# Patient Record
Sex: Male | Born: 1950
Health system: Southern US, Community
[De-identification: ages and names within clinical notes are randomized; demographics above are authoritative.]

## PROBLEM LIST (undated history)

## (undated) DIAGNOSIS — K5730 Diverticulosis of large intestine without perforation or abscess without bleeding: Secondary | ICD-10-CM

## (undated) DIAGNOSIS — Z87442 Personal history of urinary calculi: Secondary | ICD-10-CM

## (undated) DIAGNOSIS — I10 Essential (primary) hypertension: Secondary | ICD-10-CM

## (undated) DIAGNOSIS — K635 Polyp of colon: Secondary | ICD-10-CM

## (undated) DIAGNOSIS — K219 Gastro-esophageal reflux disease without esophagitis: Secondary | ICD-10-CM

## (undated) DIAGNOSIS — G4733 Obstructive sleep apnea (adult) (pediatric): Secondary | ICD-10-CM

## (undated) DIAGNOSIS — F32A Depression, unspecified: Secondary | ICD-10-CM

## (undated) DIAGNOSIS — F419 Anxiety disorder, unspecified: Secondary | ICD-10-CM

## (undated) DIAGNOSIS — R011 Cardiac murmur, unspecified: Secondary | ICD-10-CM

## (undated) DIAGNOSIS — E669 Obesity, unspecified: Secondary | ICD-10-CM

## (undated) DIAGNOSIS — F411 Generalized anxiety disorder: Secondary | ICD-10-CM

## (undated) HISTORY — PX: COLONOSCOPY: SHX174

## (undated) HISTORY — DX: Anxiety disorder, unspecified: F41.9

## (undated) HISTORY — PX: FRACTURE SURGERY: SHX138

## (undated) HISTORY — DX: Polyp of colon: K63.5

## (undated) HISTORY — DX: Obstructive sleep apnea (adult) (pediatric): G47.33

## (undated) HISTORY — PX: EYE SURGERY: SHX253

## (undated) HISTORY — DX: Gastro-esophageal reflux disease without esophagitis: K21.9

## (undated) HISTORY — DX: Generalized anxiety disorder: F41.1

## (undated) HISTORY — DX: Diverticulosis of large intestine without perforation or abscess without bleeding: K57.30

## (undated) HISTORY — PX: WRIST SURGERY: SHX841

## (undated) HISTORY — DX: Obesity, unspecified: E66.9

---

## 2001-02-03 ENCOUNTER — Emergency Department (HOSPITAL_COMMUNITY): Admission: EM | Admit: 2001-02-03 | Discharge: 2001-02-03 | Payer: Self-pay | Admitting: *Deleted

## 2001-02-03 ENCOUNTER — Inpatient Hospital Stay (HOSPITAL_COMMUNITY): Admission: RE | Admit: 2001-02-03 | Discharge: 2001-02-05 | Payer: Self-pay | Admitting: Internal Medicine

## 2001-05-22 DIAGNOSIS — K635 Polyp of colon: Secondary | ICD-10-CM

## 2001-05-22 HISTORY — DX: Polyp of colon: K63.5

## 2003-09-02 ENCOUNTER — Ambulatory Visit (HOSPITAL_BASED_OUTPATIENT_CLINIC_OR_DEPARTMENT_OTHER): Admission: RE | Admit: 2003-09-02 | Discharge: 2003-09-02 | Payer: Self-pay | Admitting: Internal Medicine

## 2003-12-09 ENCOUNTER — Ambulatory Visit (HOSPITAL_BASED_OUTPATIENT_CLINIC_OR_DEPARTMENT_OTHER): Admission: RE | Admit: 2003-12-09 | Discharge: 2003-12-09 | Payer: Self-pay | Admitting: Pulmonary Disease

## 2004-07-25 ENCOUNTER — Ambulatory Visit: Payer: Self-pay | Admitting: Internal Medicine

## 2005-01-19 ENCOUNTER — Ambulatory Visit: Payer: Self-pay | Admitting: Internal Medicine

## 2006-05-22 DIAGNOSIS — K5730 Diverticulosis of large intestine without perforation or abscess without bleeding: Secondary | ICD-10-CM

## 2006-05-22 HISTORY — DX: Diverticulosis of large intestine without perforation or abscess without bleeding: K57.30

## 2007-03-18 ENCOUNTER — Ambulatory Visit: Payer: Self-pay | Admitting: Internal Medicine

## 2007-03-26 ENCOUNTER — Ambulatory Visit: Payer: Self-pay | Admitting: Internal Medicine

## 2007-03-26 ENCOUNTER — Encounter: Payer: Self-pay | Admitting: Internal Medicine

## 2007-04-24 ENCOUNTER — Encounter: Payer: Self-pay | Admitting: Internal Medicine

## 2007-08-10 ENCOUNTER — Telehealth: Payer: Self-pay | Admitting: Internal Medicine

## 2007-08-10 DIAGNOSIS — G4733 Obstructive sleep apnea (adult) (pediatric): Secondary | ICD-10-CM | POA: Insufficient documentation

## 2007-08-10 DIAGNOSIS — S335XXA Sprain of ligaments of lumbar spine, initial encounter: Secondary | ICD-10-CM

## 2010-10-07 NOTE — Procedures (Signed)
NAME:  Jorge Burton, SANROMAN              ACCOUNT NO.:  [id number]   MEDICAL RECORD NO.:  [id number]          PATIENT TYPE:  OUT   LOCATION:  SLEEP CENTER                 FACILITY:  [facility]   PHYSICIAN:  Marcelyn Bruins, M.D. [facility] DATE OF BIRTH:  05/15/1951   DATE OF ADMISSION:  12/09/2003  DATE OF DISCHARGE:  12/09/2003                              NOCTURNAL POLYSOMNOGRAM   REFERRING PHYSICIAN:  Marcelyn Bruins, M.D.   INDICATION FOR THE STUDY:  Hypersomnia with sleep apnea.  The patient is  here for a continuous positive airway pressure titration study for pressure  optimization.   SLEEP ARCHITECTURE:  Patient had a total sleep time of 386 minutes with no  slow wave sleep and decreased REM.  Sleep onset latency was normal and REM  latency was slightly prolonged.   IMPRESSION:  1. Good control of previously documented obstructive sleep apnea with     continuous positive airway pressure of 14 cm.  Patient used his full face     mask from home without difficulty.  2. No clinically significant cardiac arrhythmias.  3. Moderate numbers of leg jerks without significant sleep disruption.                                   ______________________________                                Marcelyn Bruins, M.D. [facility]     KC/MEDQ  D:  12/11/2003 16:27:43  T:  12/11/2003 19:36:51  Job:  [id number]

## 2012-03-29 ENCOUNTER — Encounter: Payer: Self-pay | Admitting: Internal Medicine

## 2012-09-30 ENCOUNTER — Encounter: Payer: Self-pay | Admitting: Internal Medicine

## 2013-09-26 ENCOUNTER — Encounter: Payer: Self-pay | Admitting: Internal Medicine

## 2013-10-01 ENCOUNTER — Encounter: Payer: Self-pay | Admitting: Pulmonary Disease

## 2013-10-01 ENCOUNTER — Telehealth: Payer: Self-pay | Admitting: Pulmonary Disease

## 2013-10-01 ENCOUNTER — Ambulatory Visit (INDEPENDENT_AMBULATORY_CARE_PROVIDER_SITE_OTHER): Payer: BC Managed Care – PPO | Admitting: Pulmonary Disease

## 2013-10-01 ENCOUNTER — Encounter (INDEPENDENT_AMBULATORY_CARE_PROVIDER_SITE_OTHER): Payer: Self-pay

## 2013-10-01 VITALS — BP 140/88 | HR 71 | Temp 98.1°F | Ht 67.0 in | Wt 212.6 lb

## 2013-10-01 DIAGNOSIS — G4733 Obstructive sleep apnea (adult) (pediatric): Secondary | ICD-10-CM

## 2013-10-01 NOTE — Assessment & Plan Note (Signed)
The patient has a history of obstructive sleep apnea, and has been on CPAP since 2005 been doing well. He continues to be compliant with the device, but his humidifier quit working, and is having issues with his aged machine. He will obviously need a new CPAP machine, as well as a new mask and supplies. I will set his new device on the automatic setting in light of his weight gain over the years, and he will let us know if he prefers a fixed pressure setting. I've also encouraged him to work aggressively on weight loss.

## 2013-10-01 NOTE — Telephone Encounter (Signed)
Jorge Burton with [facility] calling wanting to let Dr Gwenette Greet know that they have not been able to locate pt sleep study--have called [facility] and they do not have this study in file room. States also that they do not have this study in their files either.  Per Jorge Burton, a new sleep study will need to be ordered before moving forward.  Please advise Dr Gwenette Greet. Thanks.

## 2013-10-01 NOTE — Patient Instructions (Signed)
Will get you a new cpap machine, and set on auto to self regulate.  Please call if you would rather go back to a fixed pressure. Work on weight loss followup with me again in one year if doing well.

## 2013-10-01 NOTE — Progress Notes (Signed)
Subjective:    Patient ID: Jorge Burton, male    DOB: 1950-09-28, 63 y.o.   MRN: [id number]  HPI The patient is a 63 year old male who I've been asked to see for management of obstructive sleep apnea. He was diagnosed in 2005, and has been on CPAP at 14 cm since that time. He has done very well with the device over the years, but currently is having issues with his machine and her humidifier. He uses nasal pillows, and is not having issues with oral venting. He has done very well with the device over the years, and continues to feel that he sleeps well. He is rested in the mornings upon arising, and denies any alertness issues during the day. His Epworth score today is 9. He tells me that he has gained about 20 pounds from his initial sleep study, and has not had any changes to his device. He is also in need of a new mask and headgear.   Sleep Questionnaire What time do you typically go to bed?( Between what hours) 11pm 11pm at 0900 on 10/01/13 by Lilli Few, CMA How long does it take you to fall asleep? 15 minutes 15 minutes at 0900 on 10/01/13 by Lilli Few, CMA How many times during the night do you wake up? 2 2 at 0900 on 10/01/13 by Lilli Few, [place] What time do you get out of bed to start your day? 0600 0600 at 0900 on 10/01/13 by Lilli Few, CMA Do you drive or operate heavy machinery in your occupation? No No at 0900 on 10/01/13 by Lilli Few, CMA How much has your weight changed (up or down) over the past two years? (In pounds) 20 lb (9.072 kg) 20 lb (9.072 kg) at 0900 on 10/01/13 by Lilli Few, CMA Have you ever had a sleep study before? Yes Yes at 0900 on 10/01/13 by Lilli Few, CMA If yes, location of study? [facility] at 0900 on 10/01/13 by Lilli Few, CMA If yes, date of study? 2005 2005 at 0900 on 10/01/13 by Lilli Few, CMA Do you currently use CPAP? Yes Yes at 0900  on 10/01/13 by Lilli Few, CMA If so, what pressure? unknown unknown at 0900 on 10/01/13 by Lilli Few, CMA Do you wear oxygen at any time? No No at 0900 on 10/01/13 by Lilli Few, CMA   Review of Systems  Constitutional: Negative for fever and unexpected weight change.  HENT: Positive for dental problem and trouble swallowing. Negative for congestion, ear pain, nosebleeds, postnasal drip, rhinorrhea, sinus pressure, sneezing and sore throat.   Eyes: Negative for redness and itching.  Respiratory: Negative for cough, chest tightness, shortness of breath and wheezing.   Cardiovascular: Negative for palpitations and leg swelling.  Gastrointestinal: Negative for nausea and vomiting.  Genitourinary: Negative for dysuria.  Musculoskeletal: Negative for joint swelling.  Skin: Negative for rash.  Neurological: Negative for headaches.  Hematological: Does not bruise/bleed easily.  Psychiatric/Behavioral: Negative for dysphoric mood. The patient is nervous/anxious.        Objective:   Physical Exam Constitutional:  Overweight male, no acute distress  HENT:  Nares patent without discharge, mildly narrowed bilat.  Oropharynx without exudate, palate and uvula are elongated.  Eyes:  Perrla, eomi, no scleral icterus  Neck:  No JVD, no TMG  Cardiovascular:  Normal rate, regular rhythm, no rubs or gallops.  No murmurs  Intact distal pulses  Pulmonary :  Normal breath sounds, no stridor or respiratory distress   No rales, rhonchi, or wheezing  Abdominal:  Soft, nondistended, bowel sounds present.  No tenderness noted.   Musculoskeletal:  No lower extremity edema noted.  Lymph Nodes:  No cervical lymphadenopathy noted  Skin:  No cyanosis noted  Neurologic:  Alert, appropriate, moves all 4 extremities without obvious deficit.         Assessment & Plan:

## 2013-10-02 NOTE — Telephone Encounter (Signed)
Who has been doing his supplies and mask changes over the years.  If it is [facility], he does NOT need another sleep study.  If it is someone else, he needs to see whoever that is about a new machine.

## 2013-10-03 NOTE — Telephone Encounter (Signed)
lmtcb x1 for [employer]

## 2013-10-07 ENCOUNTER — Encounter: Payer: Self-pay | Admitting: Internal Medicine

## 2013-10-07 ENCOUNTER — Encounter: Payer: Self-pay | Admitting: Pulmonary Disease

## 2013-10-07 NOTE — Telephone Encounter (Signed)
mctcb for [email or web address]  [name]

## 2013-10-16 ENCOUNTER — Emergency Department: Payer: Self-pay | Admitting: Emergency Medicine

## 2013-11-25 ENCOUNTER — Ambulatory Visit (INDEPENDENT_AMBULATORY_CARE_PROVIDER_SITE_OTHER): Payer: BC Managed Care – PPO | Admitting: Internal Medicine

## 2013-11-25 ENCOUNTER — Encounter: Payer: Self-pay | Admitting: Internal Medicine

## 2013-11-25 VITALS — BP 120/80 | HR 72 | Ht 67.0 in | Wt 208.0 lb

## 2013-11-25 DIAGNOSIS — R1314 Dysphagia, pharyngoesophageal phase: Secondary | ICD-10-CM

## 2013-11-25 DIAGNOSIS — Z800 Family history of malignant neoplasm of digestive organs: Secondary | ICD-10-CM

## 2013-11-25 DIAGNOSIS — Z86010 Personal history of colonic polyps: Secondary | ICD-10-CM

## 2013-11-25 MED ORDER — MOVIPREP 100 G PO SOLR: ORAL | Status: DC

## 2013-11-25 NOTE — Patient Instructions (Signed)
You have been scheduled for an endoscopy and colonoscopy. Please follow the written instructions given to you at your visit today. Please pick up your prep at the pharmacy within the next 1-3 days. If you use inhalers (even only as needed), please bring them with you on the day of your procedure. Your physician has requested that you go to www.startemmi.com and enter the access code given to you at your visit today. This web site gives a general overview about your procedure. However, you should still follow specific instructions given to you by our office regarding your preparation for the procedure.  I appreciate the opportunity to care for you.

## 2013-11-26 ENCOUNTER — Encounter: Payer: Self-pay | Admitting: Internal Medicine

## 2013-11-26 NOTE — Progress Notes (Signed)
Subjective:    Patient ID: Jorge Burton, male    DOB: [birth year], 63 y.o.   MRN: [id number]  HPI The patient is here because of intermittent solid dysphagia. He is also due for a routine repeat colonoscopy  - hx polyps and FHx rectal cancer in elderly father.  He has had years of the intermittent soild dysphagia with food impactions and regurgitation. No weight loss or bleeding.   GI ROS o/w negative.  Allergies  Allergen Reactions  . Penicillins    Outpatient Prescriptions Prior to Visit  Medication Sig Dispense Refill  . aspirin 81 MG tablet Take 81 mg by mouth daily.      Marland Kitchen escitalopram (LEXAPRO) 10 MG tablet Take 1 tablet by mouth daily.       No facility-administered medications prior to visit.   Past Medical History  Diagnosis Date  . Generalized anxiety disorder   . OSA (obstructive sleep apnea)     CPAP  . Obesity   . Diverticulosis of colon (without mention of hemorrhage) 2008  . Colon polyps 2003    Adenomatous polyp  . Anxiety    Past Surgical History  Procedure Laterality Date  . Wrist surgery    . Colonoscopy      mulitple    History   Social History  . Marital Status: Married    Spouse Name: N/A    Number of Children: 2  . Years of Education: N/A   Occupational History  . teacher     [employer]  . professor -retired [employer]   Social History Main Topics  . Smoking status: Never Smoker   . Smokeless tobacco: Never Used  . Alcohol Use: No  . Drug Use: No  .      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 Colon cancer Father 41  . Heart attack Father   .  Heart failure Mother     Review of Systems + allergies, anxiety and reduced vision All other ROS negative    Objective:   Physical Exam General:  Well-developed, well-nourished and in no acute distress Eyes:  anicteric. ENT:   Mouth and posterior pharynx free of lesions.  Neck:   supple w/o thyromegaly or mass.  Lungs: Clear to auscultation bilaterally. Heart:  S1S2, no rubs, murmurs, gallops. Abdomen:  soft, non-tender, no hepatosplenomegaly, hernia, or mass and BS+.  Rectal: deferred Lymph:  no cervical or supraclavicular adenopathy. Extremities:   no edema Neuro:  A&O x 3.  Psych:  appropriate mood and  Affect.   Data Reviewed: PCP note Prior colonoscopy CMET CBC ok 11/19/2013     Assessment & Plan:  Dysphagia, pharyngoesophageal phase -    EGD, dilate esophagus. The risks and benefits as well as alternatives of endoscopic procedure(s) have been discussed and reviewed. All questions answered. The patient agrees to proceed.   Personal history of colonic polyps -   Family history of rectal cancer father >70 -    Surveillance colonoscopy The risks and benefits as well as alternatives of endoscopic procedure(s) have been discussed and reviewed. All questions answered. The patient  agrees to proceed.   I appreciate the opportunity to care for this patient. JS:EGBT, Gwyndolyn Saxon, MD

## 2013-12-11 ENCOUNTER — Encounter: Payer: Self-pay | Admitting: Internal Medicine

## 2013-12-11 ENCOUNTER — Ambulatory Visit (AMBULATORY_SURGERY_CENTER): Payer: BC Managed Care – PPO | Admitting: Internal Medicine

## 2013-12-11 VITALS — BP 126/73 | HR 65 | Temp 96.9°F | Resp 14 | Ht 67.0 in | Wt 208.0 lb

## 2013-12-11 DIAGNOSIS — D126 Benign neoplasm of colon, unspecified: Secondary | ICD-10-CM

## 2013-12-11 DIAGNOSIS — K222 Esophageal obstruction: Secondary | ICD-10-CM

## 2013-12-11 DIAGNOSIS — Z860100 Personal history of colon polyps, unspecified: Secondary | ICD-10-CM | POA: Insufficient documentation

## 2013-12-11 DIAGNOSIS — K219 Gastro-esophageal reflux disease without esophagitis: Secondary | ICD-10-CM

## 2013-12-11 DIAGNOSIS — R1314 Dysphagia, pharyngoesophageal phase: Secondary | ICD-10-CM

## 2013-12-11 DIAGNOSIS — N402 Nodular prostate without lower urinary tract symptoms: Secondary | ICD-10-CM | POA: Insufficient documentation

## 2013-12-11 MED ORDER — PANTOPRAZOLE SODIUM 40 MG PO TBEC: 40.0000 mg | DELAYED_RELEASE_TABLET | Freq: Every day | ORAL | Status: DC

## 2013-12-11 MED ORDER — SODIUM CHLORIDE 0.9 % IV SOLN: 500.0000 mL | INTRAVENOUS | Status: DC

## 2013-12-11 NOTE — Patient Instructions (Addendum)
There was a stricture at junction of esophagus and stomach - I dilated it to 15 mm. Otherwise ok.  You need to start pantoprazole for this - to prevent recurrent stricture formation. Pick up at pharmacy - prescription sent.  I found and removed 6 polyps - they all look benign.  I will let you know pathology results and when to have another routine colonoscopy by mail.   There is a nodule on your prostate - you need to have this evaluated by a urologist unless that has already been done.  I appreciate the opportunity to care for you. Gatha Mayer, MD, [facility]   Discharge instructions given with verbal understanding. Handouts on a dilatation diet,polyps and diverticulosis. Resume previous medications. YOU HAD AN ENDOSCOPIC PROCEDURE TODAY AT [facility]: Refer to the procedure report that was given to you for any specific questions about what was found during the examination.  If the procedure report does not answer your questions, please call your gastroenterologist to clarify.  If you requested that your care partner not be given the details of your procedure findings, then the procedure report has been included in a sealed envelope for you to review at your convenience later.  YOU SHOULD EXPECT: Some feelings of bloating in the abdomen. Passage of more gas than usual.  Walking can help get rid of the air that was put into your GI tract during the procedure and reduce the bloating. If you had a lower endoscopy (such as a colonoscopy or flexible sigmoidoscopy) you may notice spotting of blood in your stool or on the toilet paper. If you underwent a bowel prep for your procedure, then you may not have a normal bowel movement for a few days.  DIET: Your first meal following the procedure should be a light meal and then it is ok to progress to your normal diet.  A half-sandwich or bowl of soup is an example of a good first meal.  Heavy or fried foods are harder to digest and may  make you feel nauseous or bloated.  Likewise meals heavy in dairy and vegetables can cause extra gas to form and this can also increase the bloating.  Drink plenty of fluids but you should avoid alcoholic beverages for 24 hours.  ACTIVITY: Your care partner should take you home directly after the procedure.  You should plan to take it easy, moving slowly for the rest of the day.  You can resume normal activity the day after the procedure however you should NOT DRIVE or use heavy machinery for 24 hours (because of the sedation medicines used during the test).    SYMPTOMS TO REPORT IMMEDIATELY: A gastroenterologist can be reached at any hour.  During normal business hours, 8:30 AM to 5:00 PM Monday through Friday, call [phone].  After hours and on weekends, please call the GI answering service at [phone] who will take a message and have the physician on call contact you.   Following lower endoscopy (colonoscopy or flexible sigmoidoscopy):  Excessive amounts of blood in the stool  Significant tenderness or worsening of abdominal pains  Swelling of the abdomen that is new, acute  Fever of 100F or higher  Following upper endoscopy (EGD)  Vomiting of blood or coffee ground material  New chest pain or pain under the shoulder blades  Painful or persistently difficult swallowing  New shortness of breath  Fever of 100F or higher  Black, tarry-looking stools  FOLLOW UP: If any  biopsies were taken you will be contacted by phone or by letter within the next 1-3 weeks.  Call your gastroenterologist if you have not heard about the biopsies in 3 weeks.  Our staff will call the home number listed on your records the next business day following your procedure to check on you and address any questions or concerns that you may have at that time regarding the information given to you following your procedure. This is a courtesy call and so if there is no answer at the home number and we have not  heard from you through the emergency physician on call, we will assume that you have returned to your regular daily activities without incident.  SIGNATURES/CONFIDENTIALITY: You and/or your care partner have signed paperwork which will be entered into your electronic medical record.  These signatures attest to the fact that that the information above on your After Visit Summary has been reviewed and is understood.  Full responsibility of the confidentiality of this discharge information lies with you and/or your care-partner.

## 2013-12-11 NOTE — Progress Notes (Signed)
A/ox3 pleased with MAC, report to Celia RN 

## 2013-12-11 NOTE — Op Note (Signed)
[place]  [address] [place], [address]   COLONOSCOPY PROCEDURE REPORT  PATIENT: Jorge Burton, Jorge Burton  MR#: [id number] BIRTHDATE: 07/25/1950 , 10  yrs. old GENDER: Male ENDOSCOPIST: Gatha Mayer, MD, [facility] REFERRED BY:W.  Lutricia Feil, M.D. PROCEDURE DATE:  12/11/2013 PROCEDURE:   Colonoscopy with snare polypectomy First Screening Colonoscopy - Avg.  risk and is 50 yrs.  old or older - No.  Prior Negative Screening - Now for repeat screening. N/A  History of Adenoma - Now for follow-up colonoscopy & has been > or = to 3 yrs.  Yes hx of adenoma.  Has been 3 or more years since last colonoscopy.  Polyps Removed Today? Yes. ASA CLASS:   Class II INDICATIONS:Patient's personal history of adenomatous colon polyps.  MEDICATIONS: Propofol (Diprivan) 160 mg IV, There was residual sedation effect present from prior procedure, MAC sedation, administered by CRNA, and These medications were titrated to patient response per physician's verbal order  DESCRIPTION OF PROCEDURE:   After the risks benefits and alternatives of the procedure were thoroughly explained, informed consent was obtained.  A digital rectal exam revealed a nodule prostate.   The LB [id number]  endoscope was introduced through the anus and advanced to the cecum, which was identified by both the appendix and ileocecal valve. No adverse events experienced.   The quality of the prep was excellent, using MoviPrep  The instrument was then slowly withdrawn as the colon was fully examined.      COLON FINDINGS: Six sessile polyps measuring 2-5 mm in size were found in the transverse colon, sigmoid colon, and rectum.  A polypectomy was performed with cold forceps (2) and with a cold snare (4).  The resection was complete and the polyp tissue was completely retrieved.   Moderate diverticulosis was noted in the sigmoid colon.   The colon mucosa was otherwise normal.   Small prostate nodule - just to  left of median fissure and distal half of lobe.  Retroflexed views revealed no abnormalities. The time to cecum=minutes 0 seconds.  Withdrawal time=2 minutes 46 seconds. The scope was withdrawn and the procedure completed. COMPLICATIONS: There were no complications.  ENDOSCOPIC IMPRESSION: 1.   Six sessile polyps measuring 2-5 mm in size were found in the transverse colon, sigmoid colon, and rectum; polypectomy was performed with cold forceps and with a cold snare 2.   Moderate diverticulosis was noted in the sigmoid colon 3.   The colon mucosa was otherwise normal 4.   Small prostate nodule - just to left of median fissure and distal half of lobe  RECOMMENDATIONS: 1.  Timing of repeat colonoscopy will be determined by pathology findings. 2.   Needs to see urologist unless already has - patient to let Dr. Brigitte Pulse know and I will cc Dr.  Brigitte Pulse also   eSigned:  Gatha Mayer, MD, [facility] 12/11/2013 10:23 AM   cc: Janalyn Rouse, MD and The Patient   PATIENT NAME:  [name], [name] MR#: [id number]

## 2013-12-11 NOTE — Op Note (Addendum)
[place]  [address] [place], [address]   ENDOSCOPY PROCEDURE REPORT  PATIENT: Jorge Burton, Jorge Burton  MR#: [id number] BIRTHDATE: Jul 23, 1950 , 49  yrs. old GENDER: Male ENDOSCOPIST: Gatha Mayer, MD, [facility] REFERRED BY:  Janalyn Rouse PROCEDURE DATE:  12/11/2013 PROCEDURE:  EGD, diagnostic  + balloon dilation < 30 mm ASA CLASS:     Class II INDICATIONS:  Dysphagia. MEDICATIONS: Propofol (Diprivan) 240 mg IV, MAC sedation, administered by CRNA, and These medications were titrated to patient response per physician's verbal order TOPICAL ANESTHETIC: none  DESCRIPTION OF PROCEDURE: After the risks benefits and alternatives of the procedure were thoroughly explained, informed consent was obtained.  The LB [id number] endoscope was introduced through the mouth and advanced to the second portion of the duodenum. Without limitations.  The instrument was slowly withdrawn as the mucosa was fully examined.    ESOPHAGUS: A stricture was found at the gastroesophageal junction. The stenosis was non traversable with the endoscope until it was dilated to 15 mm with a balloon - appropriate heme seen..  The remainder of the upper endoscopy exam was otherwise normal. Retroflexed views revealed as above.     The scope was then withdrawn from the patient and the procedure completed.  COMPLICATIONS: There were no complications. ENDOSCOPIC IMPRESSION: 1.   Stricture was found at the gastroesophageal junction - dilated to 15 mm 2.   The remainder of the upper endoscopy exam was otherwise normal  RECOMMENDATIONS: 1.  Clear liquids until , then soft foods rest iof day.  Resume prior diet tomorrow. 2.  PPI qam - pantoprazole 40 mg daily before breakfast 3.   repeat dilation if not swallowing without problems after 4 weeks 4.   Colonoscopy next   eSigned:  Gatha Mayer, MD, [facility] 12/11/2013 10:16 AM Revised: 12/11/2013 10:16 AM  CC:W.  Lutricia Feil, MD and The  Patient

## 2013-12-11 NOTE — Progress Notes (Signed)
Called to room to assist during endoscopic procedure.  Patient ID and intended procedure confirmed with present staff. Received instructions for my participation in the procedure from the performing physician.  

## 2013-12-12 ENCOUNTER — Telehealth: Payer: Self-pay | Admitting: *Deleted

## 2013-12-12 NOTE — Telephone Encounter (Signed)
  Follow up Call-  Call back number 12/11/2013  Post procedure Call Back phone  # [phone]  Permission to leave phone message Yes     Patient questions:  Do you have a fever, pain , or abdominal swelling? No. Pain Score  0 *  Have you tolerated food without any problems? Yes.    Have you been able to return to your normal activities? Yes.    Do you have any questions about your discharge instructions: Diet   No. Medications  No. Follow up visit  No.  Do you have questions or concerns about your Care? No.  Actions: * If pain score is 4 or above: No action needed, pain <4.

## 2013-12-18 ENCOUNTER — Encounter: Payer: Self-pay | Admitting: Internal Medicine

## 2013-12-18 NOTE — Progress Notes (Signed)
Quick Note:  2 adenomas max 6 mm Others hyperplastic Repeat colon 2020 ______

## 2014-03-13 ENCOUNTER — Telehealth: Payer: Self-pay

## 2014-03-13 NOTE — Telephone Encounter (Signed)
DId prior authorization for pantoprazole 40mg , dx: GERD K21.9, Stricture & Stenosis of esophagus K22.2.   Approved thru 03/13/2015.  Spoke with Neoma Laming at [employer] # [phone].  Notified [facility] in [place] of approval # [id number].

## 2014-07-09 DIAGNOSIS — H43813 Vitreous degeneration, bilateral: Secondary | ICD-10-CM | POA: Diagnosis not present

## 2014-07-09 DIAGNOSIS — Z961 Presence of intraocular lens: Secondary | ICD-10-CM | POA: Diagnosis not present

## 2014-07-09 DIAGNOSIS — H2512 Age-related nuclear cataract, left eye: Secondary | ICD-10-CM | POA: Diagnosis not present

## 2014-07-09 DIAGNOSIS — H26491 Other secondary cataract, right eye: Secondary | ICD-10-CM | POA: Diagnosis not present

## 2014-08-24 DIAGNOSIS — N402 Nodular prostate without lower urinary tract symptoms: Secondary | ICD-10-CM | POA: Diagnosis not present

## 2014-08-28 DIAGNOSIS — N402 Nodular prostate without lower urinary tract symptoms: Secondary | ICD-10-CM | POA: Diagnosis not present

## 2014-11-18 ENCOUNTER — Ambulatory Visit: Payer: BC Managed Care – PPO | Admitting: Pulmonary Disease

## 2014-12-03 DIAGNOSIS — Z125 Encounter for screening for malignant neoplasm of prostate: Secondary | ICD-10-CM | POA: Diagnosis not present

## 2014-12-03 DIAGNOSIS — M859 Disorder of bone density and structure, unspecified: Secondary | ICD-10-CM | POA: Diagnosis not present

## 2014-12-03 DIAGNOSIS — Z79899 Other long term (current) drug therapy: Secondary | ICD-10-CM | POA: Diagnosis not present

## 2014-12-03 DIAGNOSIS — M8580 Other specified disorders of bone density and structure, unspecified site: Secondary | ICD-10-CM | POA: Diagnosis not present

## 2014-12-03 DIAGNOSIS — E669 Obesity, unspecified: Secondary | ICD-10-CM | POA: Diagnosis not present

## 2014-12-09 DIAGNOSIS — G4733 Obstructive sleep apnea (adult) (pediatric): Secondary | ICD-10-CM | POA: Diagnosis not present

## 2014-12-09 DIAGNOSIS — Z86010 Personal history of colonic polyps: Secondary | ICD-10-CM | POA: Diagnosis not present

## 2014-12-09 DIAGNOSIS — N402 Nodular prostate without lower urinary tract symptoms: Secondary | ICD-10-CM | POA: Diagnosis not present

## 2014-12-09 DIAGNOSIS — H6123 Impacted cerumen, bilateral: Secondary | ICD-10-CM | POA: Diagnosis not present

## 2014-12-09 DIAGNOSIS — I10 Essential (primary) hypertension: Secondary | ICD-10-CM | POA: Diagnosis not present

## 2014-12-09 DIAGNOSIS — F411 Generalized anxiety disorder: Secondary | ICD-10-CM | POA: Diagnosis not present

## 2014-12-09 DIAGNOSIS — Z23 Encounter for immunization: Secondary | ICD-10-CM | POA: Diagnosis not present

## 2014-12-09 DIAGNOSIS — R1310 Dysphagia, unspecified: Secondary | ICD-10-CM | POA: Diagnosis not present

## 2014-12-09 DIAGNOSIS — M8580 Other specified disorders of bone density and structure, unspecified site: Secondary | ICD-10-CM | POA: Diagnosis not present

## 2014-12-09 DIAGNOSIS — Z6833 Body mass index (BMI) 33.0-33.9, adult: Secondary | ICD-10-CM | POA: Diagnosis not present

## 2014-12-09 DIAGNOSIS — Z0000 Encounter for general adult medical examination without abnormal findings: Secondary | ICD-10-CM | POA: Diagnosis not present

## 2014-12-10 DIAGNOSIS — Z1212 Encounter for screening for malignant neoplasm of rectum: Secondary | ICD-10-CM | POA: Diagnosis not present

## 2015-01-08 DIAGNOSIS — Z6833 Body mass index (BMI) 33.0-33.9, adult: Secondary | ICD-10-CM | POA: Diagnosis not present

## 2015-01-08 DIAGNOSIS — Z23 Encounter for immunization: Secondary | ICD-10-CM | POA: Diagnosis not present

## 2015-01-08 DIAGNOSIS — E669 Obesity, unspecified: Secondary | ICD-10-CM | POA: Diagnosis not present

## 2015-01-08 DIAGNOSIS — I10 Essential (primary) hypertension: Secondary | ICD-10-CM | POA: Diagnosis not present

## 2015-01-29 DIAGNOSIS — S82842A Displaced bimalleolar fracture of left lower leg, initial encounter for closed fracture: Secondary | ICD-10-CM | POA: Diagnosis not present

## 2015-01-29 DIAGNOSIS — I10 Essential (primary) hypertension: Secondary | ICD-10-CM | POA: Diagnosis not present

## 2015-01-29 DIAGNOSIS — R279 Unspecified lack of coordination: Secondary | ICD-10-CM | POA: Diagnosis not present

## 2015-01-29 DIAGNOSIS — Z7401 Bed confinement status: Secondary | ICD-10-CM | POA: Diagnosis not present

## 2015-01-29 DIAGNOSIS — S8262XA Displaced fracture of lateral malleolus of left fibula, initial encounter for closed fracture: Secondary | ICD-10-CM | POA: Diagnosis not present

## 2015-01-29 DIAGNOSIS — M25572 Pain in left ankle and joints of left foot: Secondary | ICD-10-CM | POA: Diagnosis not present

## 2015-12-13 DIAGNOSIS — Z125 Encounter for screening for malignant neoplasm of prostate: Secondary | ICD-10-CM | POA: Diagnosis not present

## 2015-12-13 DIAGNOSIS — I10 Essential (primary) hypertension: Secondary | ICD-10-CM | POA: Diagnosis not present

## 2015-12-13 DIAGNOSIS — M859 Disorder of bone density and structure, unspecified: Secondary | ICD-10-CM | POA: Diagnosis not present

## 2015-12-20 DIAGNOSIS — S82892S Other fracture of left lower leg, sequela: Secondary | ICD-10-CM | POA: Diagnosis not present

## 2015-12-20 DIAGNOSIS — R1310 Dysphagia, unspecified: Secondary | ICD-10-CM | POA: Diagnosis not present

## 2015-12-20 DIAGNOSIS — M8580 Other specified disorders of bone density and structure, unspecified site: Secondary | ICD-10-CM | POA: Diagnosis not present

## 2015-12-20 DIAGNOSIS — Z1389 Encounter for screening for other disorder: Secondary | ICD-10-CM | POA: Diagnosis not present

## 2015-12-20 DIAGNOSIS — E669 Obesity, unspecified: Secondary | ICD-10-CM | POA: Diagnosis not present

## 2015-12-20 DIAGNOSIS — R6882 Decreased libido: Secondary | ICD-10-CM | POA: Diagnosis not present

## 2015-12-20 DIAGNOSIS — I10 Essential (primary) hypertension: Secondary | ICD-10-CM | POA: Diagnosis not present

## 2015-12-20 DIAGNOSIS — G4733 Obstructive sleep apnea (adult) (pediatric): Secondary | ICD-10-CM | POA: Diagnosis not present

## 2015-12-20 DIAGNOSIS — Z0000 Encounter for general adult medical examination without abnormal findings: Secondary | ICD-10-CM | POA: Diagnosis not present

## 2015-12-20 DIAGNOSIS — Z86010 Personal history of colonic polyps: Secondary | ICD-10-CM | POA: Diagnosis not present

## 2015-12-20 DIAGNOSIS — N402 Nodular prostate without lower urinary tract symptoms: Secondary | ICD-10-CM | POA: Diagnosis not present

## 2015-12-20 DIAGNOSIS — M859 Disorder of bone density and structure, unspecified: Secondary | ICD-10-CM | POA: Diagnosis not present

## 2015-12-20 DIAGNOSIS — Z23 Encounter for immunization: Secondary | ICD-10-CM | POA: Diagnosis not present

## 2015-12-23 DIAGNOSIS — Z1212 Encounter for screening for malignant neoplasm of rectum: Secondary | ICD-10-CM | POA: Diagnosis not present

## 2016-02-15 ENCOUNTER — Institutional Professional Consult (permissible substitution): Payer: BC Managed Care – PPO | Admitting: Internal Medicine

## 2018-07-29 ENCOUNTER — Encounter (HOSPITAL_COMMUNITY)
Admission: RE | Disposition: A | Discharge status: Home / Self Care | Payer: Self-pay | Source: Home / Self Care | Attending: Ophthalmology

## 2018-07-29 ENCOUNTER — Inpatient Hospital Stay (HOSPITAL_COMMUNITY): Payer: Medicare Other | Admitting: Anesthesiology

## 2018-07-29 ENCOUNTER — Ambulatory Visit (HOSPITAL_COMMUNITY)
Admission: RE | Admit: 2018-07-29 | Discharge: 2018-07-29 | Disposition: A | Discharge status: Home / Self Care | Payer: Medicare Other | Attending: Ophthalmology | Admitting: Ophthalmology

## 2018-07-29 ENCOUNTER — Encounter (HOSPITAL_COMMUNITY): Payer: Self-pay | Admitting: Anesthesiology

## 2018-07-29 DIAGNOSIS — E669 Obesity, unspecified: Secondary | ICD-10-CM | POA: Insufficient documentation

## 2018-07-29 DIAGNOSIS — Z6835 Body mass index (BMI) 35.0-35.9, adult: Secondary | ICD-10-CM | POA: Diagnosis not present

## 2018-07-29 DIAGNOSIS — G4730 Sleep apnea, unspecified: Secondary | ICD-10-CM | POA: Insufficient documentation

## 2018-07-29 DIAGNOSIS — H30891 Other chorioretinal inflammations, right eye: Secondary | ICD-10-CM | POA: Insufficient documentation

## 2018-07-29 HISTORY — PX: EYE EXAMINATION UNDER ANESTHESIA: SHX1560

## 2018-07-29 LAB — CBC WITH DIFFERENTIAL/PLATELET
Abs Immature Granulocytes: 0.04 10*3/uL (ref 0.00–0.07)
BASOS ABS: 0.1 10*3/uL (ref 0.0–0.1)
Basophils Relative: 1 %
Eosinophils Absolute: 0.3 10*3/uL (ref 0.0–0.5)
Eosinophils Relative: 4 %
HCT: 42.1 % (ref 39.0–52.0)
Hemoglobin: 13.7 g/dL (ref 13.0–17.0)
Immature Granulocytes: 0 %
Lymphocytes Relative: 25 %
Lymphs Abs: 2.5 10*3/uL (ref 0.7–4.0)
MCH: 27.1 pg (ref 26.0–34.0)
MCHC: 32.5 g/dL (ref 30.0–36.0)
MCV: 83.4 fL (ref 80.0–100.0)
Monocytes Absolute: 0.7 10*3/uL (ref 0.1–1.0)
Monocytes Relative: 8 %
Neutro Abs: 6.1 10*3/uL (ref 1.7–7.7)
Neutrophils Relative %: 62 %
Platelets: 279 10*3/uL (ref 150–400)
RBC: 5.05 MIL/uL (ref 4.22–5.81)
RDW: 13.4 % (ref 11.5–15.5)
WBC: 9.7 10*3/uL (ref 4.0–10.5)
nRBC: 0 % (ref 0.0–0.2)

## 2018-07-29 LAB — HEPATIC FUNCTION PANEL
ALT: 19 U/L (ref 0–44)
AST: 20 U/L (ref 15–41)
Albumin: 3.8 g/dL (ref 3.5–5.0)
Alkaline Phosphatase: 45 U/L (ref 38–126)
Bilirubin, Direct: 0.1 mg/dL (ref 0.0–0.2)
Indirect Bilirubin: 0.5 mg/dL (ref 0.3–0.9)
Total Bilirubin: 0.6 mg/dL (ref 0.3–1.2)
Total Protein: 6.9 g/dL (ref 6.5–8.1)

## 2018-07-29 LAB — SEDIMENTATION RATE: Sed Rate: 14 mm/hr (ref 0–16)

## 2018-07-29 LAB — RAPID HIV SCREEN (HIV 1/2 AB+AG)
HIV 1/2 Antibodies: NONREACTIVE
HIV-1 P24 Antigen - HIV24: NONREACTIVE

## 2018-07-29 SURGERY — EXAM UNDER ANESTHESIA, EYE
Anesthesia: Monitor Anesthesia Care | Laterality: Right

## 2018-07-29 MED ORDER — TROPICAMIDE 1 % OP SOLN
2.0000 [drp] | Freq: Once | OPHTHALMIC | Status: DC
  Filled 2018-07-29: qty 15

## 2018-07-29 MED ORDER — DEXTROSE 5 % IV SOLN
600.0000 mg | Freq: Once | INTRAVENOUS | Status: DC
  Filled 2018-07-29: qty 12

## 2018-07-29 MED ORDER — POVIDONE-IODINE 5 % OP SOLN
OPHTHALMIC | Status: DC | PRN
  Administered 2018-07-29: 1 via OPHTHALMIC

## 2018-07-29 MED ORDER — DEXTROSE 5 % IV SOLN
660.0000 mg | Freq: Once | INTRAVENOUS | Status: AC
  Administered 2018-07-29: 660 mg via INTRAVENOUS
  Filled 2018-07-29: qty 13.2

## 2018-07-29 MED ORDER — LACTATED RINGERS IV SOLN
INTRAVENOUS | Status: DC | PRN
  Administered 2018-07-29: 22:00:00 via INTRAVENOUS

## 2018-07-29 MED ORDER — LIDOCAINE HCL (PF) 1 % IJ SOLN
INTRAMUSCULAR | Status: AC
  Filled 2018-07-29: qty 30

## 2018-07-29 MED ORDER — NEOMYCIN-POLYMYXIN-DEXAMETH 3.5-10000-0.1 OP OINT
TOPICAL_OINTMENT | OPHTHALMIC | Status: AC
  Filled 2018-07-29: qty 3.5

## 2018-07-29 MED ORDER — TETRACAINE HCL 0.5 % OP SOLN
OPHTHALMIC | Status: AC
  Filled 2018-07-29: qty 4

## 2018-07-29 MED ORDER — PHENYLEPHRINE HCL 2.5 % OP SOLN
OPHTHALMIC | Status: AC
  Filled 2018-07-29: qty 2

## 2018-07-29 MED ORDER — FOSCARNET INTRACAMERAL INJECTION 1.2 MG/0.05 ML KALEIDOSCOPE
1.2000 mg | Freq: Once | INTRAVITREAL | Status: AC
  Administered 2018-07-29: 1.2 mg via INTRAVITREAL
  Filled 2018-07-29: qty 0.05

## 2018-07-29 SURGICAL SUPPLY — 7 items
APL SWBSTK 6 STRL LF DISP (MISCELLANEOUS) ×5
APPLICATOR COTTON TIP 6 STRL (MISCELLANEOUS) IMPLANT
APPLICATOR COTTON TIP 6IN STRL (MISCELLANEOUS) ×15
DRAPE UTILITY W/TAPE 26X15 (DRAPES) ×2 IMPLANT
NDL HYPO 30X.5 LL (NEEDLE) IMPLANT
NEEDLE HYPO 30X.5 LL (NEEDLE) ×9 IMPLANT
SYRINGE 1CC SLIP TB (MISCELLANEOUS) ×6 IMPLANT

## 2018-07-29 NOTE — Op Note (Signed)
[facility] 07/29/2018 Diagnosis: Acute retinal necrosis  Procedure: Intravitreal Foscarnet injection with anterior chamber tap for vial PCR Operative Eye:  right eye  Surgeon: Royston Cowper Estimated Blood Loss: minimal Specimens for Pathology:  None Complications: none   The  patient was prepped and draped in the usual fashion for ocular surgery on the  right eye .  A lid speculum was placed.  2.4 mg in 0.61ml of Foscarnet was injected intravitreally.  An anterior chamber tap was performed.  The eye was noted to be soft after the tap.  After dilation, 20 diopter examination of the eye confirmed perfusion of the optic nerve and retina.    The patient tolerated the procedure well and was transferred to the recovery room in stable condition.   Royston Cowper MD

## 2018-07-29 NOTE — Anesthesia Postprocedure Evaluation (Signed)
Anesthesia Post Note  Patient: Jorge Burton  Procedure(s) Performed: [facility] LOCALIZATION (Right )     Patient location during evaluation: PACU Anesthesia Type: MAC Level of consciousness: awake and alert Pain management: pain level controlled Vital Signs Assessment: post-procedure vital signs reviewed and stable Respiratory status: spontaneous breathing, nonlabored ventilation and respiratory function stable Cardiovascular status: stable and blood pressure returned to baseline Postop Assessment: no apparent nausea or vomiting Anesthetic complications: no    Last Vitals:  Vitals:   07/29/18 2307 07/29/18 2309  BP:  136/83  Pulse:  72  Resp:  13  Temp: (!) 36.4 C (!) 36.4 C  SpO2:  97%    Last Pain:  Vitals:   07/29/18 2309  PainSc: 0-No pain                 Aisling Emigh,W. EDMOND

## 2018-07-29 NOTE — Transfer of Care (Signed)
Immediate Anesthesia Transfer of Care Note  Patient: Jorge Burton  Procedure(s) Performed: H[facility]el [facility] LOCALIZATION (Right )  Patient Location: PACU  Anesthesia Type:MAC  Level of Consciousness: awake, alert  and oriented  Airway & Oxygen Therapy: Patient Spontanous Breathing  Post-op Assessment: Report given to RN, Post -op Vital signs reviewed and stable and Patient moving all extremities X 4  Post vital signs: Reviewed and stable  Last Vitals:  Vitals Value Taken Time  BP 136/83 07/29/2018 11:09 PM  Temp 36.4 C 07/29/2018 11:07 PM  Pulse 73 07/29/2018 11:10 PM  Resp 21 07/29/2018 11:10 PM  SpO2 98 % 07/29/2018 11:10 PM  Vitals shown include unvalidated device data.  Last Pain: There were no vitals filed for this visit.       Complications: No apparent anesthesia complications

## 2018-07-29 NOTE — H&P (Signed)
Date of examination:  07/29/18  Indication for surgery: Retinitis right eye  Pertinent past medical history:  Past Medical History:  Diagnosis Date  . Anxiety   . Colon polyps 2003   Adenomatous polyp  . Diverticulosis of colon (without mention of hemorrhage) 2008  . Generalized anxiety disorder   . GERD (gastroesophageal reflux disease)   . Obesity   . OSA (obstructive sleep apnea)    CPAP    Pertinent ocular history: none  Pertinent family history:  Family History  Problem Relation Age of Onset  . Colon cancer Father 9  . Heart attack Father   . Heart failure Mother     General:  Healthy appearing patient in no distress.   Eyes:    Acuity OD 20/30  OS 20/25  Cc   External: Within normal limits      Anterior segment: Within normal limits         Fundus: inferior retinal whitening with MA's and vascular leakage, mild pucker OD   Impression: Acute retinal necrosis right eye  Plan: Intravitreal Foscarnet and AC tap for viral PCR  Royston Cowper

## 2018-07-29 NOTE — Anesthesia Preprocedure Evaluation (Addendum)
Anesthesia Evaluation  Patient identified by MRN, date of birth, ID band Patient awake    Reviewed: Allergy & Precautions, H&P , NPO status , Patient's Chart, lab work & pertinent test results  Airway Mallampati: II  TM Distance: >3 FB Neck ROM: Full    Dental no notable dental hx. (+) Teeth Intact, Dental Advisory Given   Pulmonary sleep apnea and Continuous Positive Airway Pressure Ventilation ,    Pulmonary exam normal breath sounds clear to auscultation       Cardiovascular negative cardio ROS   Rhythm:Regular Rate:Normal     Neuro/Psych Anxiety negative neurological ROS     GI/Hepatic Neg liver ROS, GERD  Medicated and Controlled,  Endo/Other  negative endocrine ROS  Renal/GU negative Renal ROS  negative genitourinary   Musculoskeletal   Abdominal   Peds  Hematology negative hematology ROS (+)   Anesthesia Other Findings   Reproductive/Obstetrics negative OB ROS                            Anesthesia Physical Anesthesia Plan  ASA: III  Anesthesia Plan: MAC   Post-op Pain Management:    Induction: Intravenous  PONV Risk Score and Plan: 2 and Treatment may vary due to age or medical condition  Airway Management Planned: Nasal Cannula  Additional Equipment:   Intra-op Plan:   Post-operative Plan:   Informed Consent: I have reviewed the patients History and Physical, chart, labs and discussed the procedure including the risks, benefits and alternatives for the proposed anesthesia with the patient or authorized representative who has indicated his/her understanding and acceptance.     Dental advisory given  Plan Discussed with: CRNA  Anesthesia Plan Comments:         Anesthesia Quick Evaluation

## 2018-07-29 NOTE — Brief Op Note (Signed)
07/29/2018  11:08 PM  PATIENT:  Jorge Burton  68 y.o. male  PRE-OPERATIVE DIAGNOSIS:  Right Eye retinitis  POST-OPERATIVE DIAGNOSIS:  Retinitis Right eye - Acute retinal necrosis  PROCEDURE:  Procedure(s): INTRAVITREAL INJECTION OF FOSCARNET AND ANTERIOR CHAMBER TAP  SURGEON:  Surgeon(s) and Role:    * Jalene Mullet, MD - Primary  PHYSICIAN ASSISTANT:   ASSISTANTS: none   ANESTHESIA:   local  EBL:  none   BLOOD ADMINISTERED:none  DRAINS: none   LOCAL MEDICATIONS USED:  LIDOCAINE   SPECIMEN:  No Specimen  DISPOSITION OF SPECIMEN:  Aqueous - AC tap for Viral PCR  COUNTS:  YES  TOURNIQUET:  * No tourniquets in log *  DICTATION: .Note written in EPIC  PLAN OF CARE: Discharge to home after PACU  PATIENT DISPOSITION:  PACU - hemodynamically stable.   Delay start of Pharmacological VTE agent (>24hrs) due to surgical blood loss or risk of bleeding: not applicable

## 2018-07-30 ENCOUNTER — Encounter (HOSPITAL_COMMUNITY): Payer: Self-pay | Admitting: Ophthalmology

## 2018-07-30 LAB — RPR: RPR: NONREACTIVE

## 2018-07-30 MED ORDER — TETRACAINE HCL 0.5 % OP SOLN
OPHTHALMIC | Status: DC | PRN
  Administered 2018-07-29: 2 [drp] via OPHTHALMIC

## 2018-07-30 MED ORDER — LIDOCAINE HCL (PF) 1 % IJ SOLN
INTRAMUSCULAR | Status: DC | PRN
  Administered 2018-07-29: 1 mg

## 2018-07-31 LAB — INFECT DISEASE AB IGM REFLEX 1

## 2018-07-31 LAB — FLUORESCENT TREPONEMAL AB(FTA)-IGG-BLD: Fluorescent Treponemal Ab, IgG: NONREACTIVE

## 2018-07-31 LAB — TOXOPLASMA GONDII ANTIBODY, IGM: Toxoplasma Antibody- IgM: <3 AU/mL (ref 0.0–7.9)

## 2018-07-31 NOTE — Discharge Summary (Signed)
Follow-up as scheduled with Dr. Posey Pronto on Thursday for reevaluation, sooner if vision changes

## 2018-08-01 LAB — CMV DNA, QUANTITATIVE, PCR
CMV DNA Quant: NEGATIVE IU/mL
Log10 CMV Qn DNA Pl: UNDETERMINED log10 IU/mL

## 2018-08-01 LAB — VARICELLA-ZOSTER BY PCR: Varicella-Zoster, PCR: NEGATIVE

## 2018-08-02 LAB — EPSTEIN BARR VRS(EBV DNA BY PCR)
EBV DNA QN by PCR: NEGATIVE copies/mL
log10 EBV DNA Qn PCR: UNDETERMINED log10 copy/mL

## 2018-08-04 LAB — HSV DNA BY PCR (REFERENCE LAB)
HSV 1 DNA: NEGATIVE
HSV 2 DNA: NEGATIVE

## 2018-09-17 ENCOUNTER — Encounter: Payer: Self-pay | Admitting: Internal Medicine

## 2018-09-17 ENCOUNTER — Other Ambulatory Visit: Payer: Self-pay

## 2018-09-17 ENCOUNTER — Ambulatory Visit (INDEPENDENT_AMBULATORY_CARE_PROVIDER_SITE_OTHER): Payer: Medicare Other | Admitting: Internal Medicine

## 2018-09-17 VITALS — Ht 68.0 in | Wt 210.0 lb

## 2018-09-17 DIAGNOSIS — Z800 Family history of malignant neoplasm of digestive organs: Secondary | ICD-10-CM | POA: Diagnosis not present

## 2018-09-17 DIAGNOSIS — Z86010 Personal history of colonic polyps: Secondary | ICD-10-CM | POA: Diagnosis not present

## 2018-09-17 DIAGNOSIS — K625 Hemorrhage of anus and rectum: Secondary | ICD-10-CM

## 2018-09-17 NOTE — Patient Instructions (Addendum)
As we discussed we will arrange for a colonoscopy to be performed in May.  Please let me know if anything changes or worsens before then.  My staff will be contacting you about the appointments you need to get set up for and then to have the colonoscopy.  I appreciate the opportunity to care for you.  Gatha Mayer, MD, [facility]     Colon instructions mailed to patient.

## 2018-09-17 NOTE — Progress Notes (Signed)
TELEHEALTH ENCOUNTER IN SETTING OF COVID-19 PANDEMIC - REQUESTED BY PATIENT SERVICE PROVIDED BY TELEMEDECINE - TYPE: Telephone PATIENT LOCATION: Home PATIENT HAS CONSENTED TO TELEHEALTH VISIT PROVIDER LOCATION: OFFICE REFERRING PROVIDER:Dr. Lang Burton PARTICIPANTS OTHER THAN PATIENT:None TIME SPENT ON CALL:20 minutes    Jorge Burton 68 y.o. 1951-02-05 [id number]  Assessment & Plan:   Encounter Diagnoses  Name Primary?  . Rectal bleeding Yes  . Hx of adenomatous colonic polyps   . Family history of colon cancer in elderly father     We have decided to go ahead and schedule a colonoscopy for May given the new rectal bleeding and the setting of a history of colon polyps.  He is somewhat concerned given that his father had colon cancer as well though technically he should not be at increased risk since his father was 85 his polyp history does increase his risk of colon cancer.  I appreciate the opportunity to care for this patient. CC: Jorge Redwood, MD    Subjective:   Chief Complaint: rectal bleeding  HPI The patient is a 68 year old white man with a history of colon polyps, adenomatous, due for a follow-up colonoscopy routinely in July 2020.  His elderly father had colon cancer diagnosed at age 31.  He reports rectal bleeding off and on in the past 3 weeks which has alarmed him in the context of his prior history of polyps and father's history.  There is no rectal pain abdominal pain or significant change in bowel habits.  The bleeding has been on the toilet paper.  GI review of systems is otherwise negative at this time.  He has been having visual disturbances in the right eye of unclear etiology.  There is some sort of a defect or necrosis of the retina, it has been treated for infection and with steroids and though not deteriorating it has not improved.  Hemoglobin was normal in early March when he had eye surgery. Allergies  Allergen Reactions  . Penicillins     Current Meds  Medication Sig  . aspirin 81 MG tablet Take 81 mg by mouth daily.  Marland Kitchen escitalopram (LEXAPRO) 10 MG tablet Take 1 tablet by mouth daily.  Marland Kitchen olmesartan (BENICAR) 40 MG tablet Take 40 mg by mouth daily.   Past Medical History:  Diagnosis Date  . Anxiety   . Colon polyps 2003   Adenomatous polyp  . Diverticulosis of colon (without mention of hemorrhage) 2008  . Generalized anxiety disorder   . GERD (gastroesophageal reflux disease)   . Obesity   . OSA (obstructive sleep apnea)    CPAP   Past Surgical History:  Procedure Laterality Date  . COLONOSCOPY     mulitple   . EYE EXAMINATION UNDER ANESTHESIA Right 07/29/2018   Procedure: INTRA-VITREOUS LOCALIZATION;  Surgeon: Jorge Mullet, MD;  Location: [facility];  Service: Ophthalmology;  Laterality: Right;  . EYE SURGERY     right eye - cataract   . 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   Review of Systems See HPI.  All other review of systems appear negative at this time.

## 2018-09-23 ENCOUNTER — Encounter: Payer: Self-pay | Admitting: *Deleted

## 2018-09-27 LAB — HERPES CULTURE, RAPID

## 2018-10-07 ENCOUNTER — Encounter: Payer: Medicare Other | Admitting: Internal Medicine

## 2018-10-08 ENCOUNTER — Telehealth: Payer: Self-pay | Admitting: *Deleted

## 2018-10-08 NOTE — Telephone Encounter (Signed)
Covid-19 travel screening questions  Have you traveled in the last 14 days? no If yes where?  Do you now or have you had a fever in the last 14 days? no  Do you have any respiratory symptoms of shortness of breath or cough now or in the last 14 days? no  Do you have any family members or close contacts with diagnosed or suspected Covid-19? No  Pt is aware that care partner will be waiting in car during procedure.  He will wear a mask into building.

## 2018-10-10 ENCOUNTER — Encounter: Payer: Self-pay | Admitting: Internal Medicine

## 2018-10-10 ENCOUNTER — Other Ambulatory Visit: Payer: Self-pay

## 2018-10-10 ENCOUNTER — Ambulatory Visit (AMBULATORY_SURGERY_CENTER): Payer: Medicare Other | Admitting: Internal Medicine

## 2018-10-10 VITALS — BP 129/83 | HR 57 | Temp 98.9°F | Resp 18 | Ht 68.0 in | Wt 210.0 lb

## 2018-10-10 DIAGNOSIS — K648 Other hemorrhoids: Secondary | ICD-10-CM | POA: Diagnosis not present

## 2018-10-10 DIAGNOSIS — K5730 Diverticulosis of large intestine without perforation or abscess without bleeding: Secondary | ICD-10-CM

## 2018-10-10 DIAGNOSIS — Z1211 Encounter for screening for malignant neoplasm of colon: Secondary | ICD-10-CM

## 2018-10-10 DIAGNOSIS — K621 Rectal polyp: Secondary | ICD-10-CM

## 2018-10-10 DIAGNOSIS — D128 Benign neoplasm of rectum: Secondary | ICD-10-CM

## 2018-10-10 MED ORDER — SODIUM CHLORIDE 0.9 % IV SOLN: 500.0000 mL | Freq: Once | INTRAVENOUS | Status: DC

## 2018-10-10 NOTE — Progress Notes (Signed)
To PACU, VSS. Report to [email or web address] 

## 2018-10-10 NOTE — Progress Notes (Signed)
Pt's states no medical or surgical changes since previsit or office visit. 

## 2018-10-10 NOTE — Patient Instructions (Addendum)
Nothing bad - I think you bled from hemorrhoids. I did remove 3 tiny rectal polyps - 2 were recovered but I am certain they are benign.  I will let you know pathology results and when to have another routine colonoscopy by mail and/or My Chart.  I appreciate the opportunity to care for you. Gatha Mayer, MD, [facility]   Handouts given for polyps, diverticulosis, hemorrhoids and high fiber diet.  You may also use Preparation H as needed.YOU HAD AN ENDOSCOPIC PROCEDURE TODAY AT [facility]:   Refer to the procedure report that was given to you for any specific questions about what was found during the examination.  If the procedure report does not answer your questions, please call your gastroenterologist to clarify.  If you requested that your care partner not be given the details of your procedure findings, then the procedure report has been included in a sealed envelope for you to review at your convenience later.  YOU SHOULD EXPECT: Some feelings of bloating in the abdomen. Passage of more gas than usual.  Walking can help get rid of the air that was put into your GI tract during the procedure and reduce the bloating. If you had a lower endoscopy (such as a colonoscopy or flexible sigmoidoscopy) you may notice spotting of blood in your stool or on the toilet paper. If you underwent a bowel prep for your procedure, you may not have a normal bowel movement for a few days.  Please Note:  You might notice some irritation and congestion in your nose or some drainage.  This is from the oxygen used during your procedure.  There is no need for concern and it should clear up in a day or so.  SYMPTOMS TO REPORT IMMEDIATELY:   Following lower endoscopy (colonoscopy or flexible sigmoidoscopy):  Excessive amounts of blood in the stool  Significant tenderness or worsening of abdominal pains  Swelling of the abdomen that is new, acute  Fever of 100F or higher  For urgent or emergent  issues, a gastroenterologist can be reached at any hour by calling [phone].   DIET:  We do recommend a small meal at first, but then you may proceed to your regular diet.  Drink plenty of fluids but you should avoid alcoholic beverages for 24 hours.  ACTIVITY:  You should plan to take it easy for the rest of today and you should NOT DRIVE or use heavy machinery until tomorrow (because of the sedation medicines used during the test).    FOLLOW UP: Our staff will call the number listed on your records 48-72 hours following your procedure to check on you and address any questions or concerns that you may have regarding the information given to you following your procedure. If we do not reach you, we will leave a message.  We will attempt to reach you two times.  During this call, we will ask if you have developed any symptoms of COVID 19. If you develop any symptoms (for example fever, flu-like symptoms, shortness of breath, cough etc.) before then, please call [phone].  If any biopsies were taken you will be contacted by phone or by letter within the next 1-3 weeks.  Please call us at [phone] if you have not heard about the biopsies in 3 weeks.    SIGNATURES/CONFIDENTIALITY: You and/or your care partner have signed paperwork which will be entered into your electronic medical record.  These signatures attest to the fact that that  the information above on your After Visit Summary has been reviewed and is understood.  Full responsibility of the confidentiality of this discharge information lies with you and/or your care-partner.

## 2018-10-10 NOTE — Op Note (Signed)
[facility] Patient Name: Jorge Burton Procedure Date: 10/10/2018 8:06 AM MRN: [id number] Endoscopist: Gatha Mayer , MD Age: 68 Referring MD:  Date of Birth: 01/19/1951 Gender: Male Account #: [id number] Procedure:                Colonoscopy Indications:              Rectal bleeding, Personal history of colonic polyps Medicines:                Propofol per Anesthesia, Monitored Anesthesia Care Procedure:                Pre-Anesthesia Assessment:                           - Prior to the procedure, a History and Physical                            was performed, and patient medications and                            allergies were reviewed. The patient's tolerance of                            previous anesthesia was also reviewed. The risks                            and benefits of the procedure and the sedation                            options and risks were discussed with the patient.                            All questions were answered, and informed consent                            was obtained. Prior Anticoagulants: The patient has                            taken no previous anticoagulant or antiplatelet                            agents. ASA Grade Assessment: II - A patient with                            mild systemic disease. After reviewing the risks                            and benefits, the patient was deemed in                            satisfactory condition to undergo the procedure.                           After obtaining informed consent, the colonoscope  was passed under direct vision. Throughout the                            procedure, the patient's blood pressure, pulse, and                            oxygen saturations were monitored continuously. The                            Model CF-HQ190L [id number]) scope was introduced                            through the anus and advanced to the the cecum,              identified by appendiceal orifice and ileocecal                            valve. The patient tolerated the procedure well.                            The colonoscopy was somewhat difficult due to                            significant looping. Successful completion of the                            procedure was aided by applying abdominal pressure.                            The quality of the bowel preparation was excellent.                            The ileocecal valve, appendiceal orifice, and                            rectum were photographed. The bowel preparation                            used was Miralax via split dose instruction. Scope In: 8:09:15 AM Scope Out: 8:28:24 AM Scope Withdrawal Time: 0 hours 13 minutes 8 seconds  Total Procedure Duration: 0 hours 19 minutes 9 seconds  Findings:                 The perianal and digital rectal examinations were                            normal. Pertinent negatives include normal prostate                            (size, shape, and consistency).                           Three sessile polyps were found in the rectum. The  polyps were diminutive in size. These polyps were                            removed with a cold snare. Resection was complete,                            but the polyp tissue was only partially retrieved.                            Verification of patient identification for the                            specimen was done. Estimated blood loss was minimal.                           External hemorrhoids were found.                           Multiple diverticula were found in the sigmoid                            colon.                           The exam was otherwise without abnormality on                            direct and retroflexion views. Complications:            No immediate complications. Estimated Blood Loss:     Estimated blood loss was minimal. Impression:                - Three diminutive polyps in the rectum, removed                            with a cold snare. Complete resection. Partial                            retrieval.                           - External hemorrhoids.                           - Diverticulosis in the sigmoid colon.                           - The examination was otherwise normal on direct                            and retroflexion views.                           - Personal history of colonic polyps. And elderly  father with history of colon cancer. Recommendation:           - Patient has a contact number available for                            emergencies. The signs and symptoms of potential                            delayed complications were discussed with the                            patient. Return to normal activities tomorrow.                            Written discharge instructions were provided to the                            patient.                           - Resume previous diet.                           - Continue present medications.                           - Repeat colonoscopy is recommended for                            surveillance. The colonoscopy date will be                            determined after pathology results from today's                            exam become available for review.                           - Preparation H prn, High fiber diet Gatha Mayer, MD 10/10/2018 8:40:13 AM This report has been signed electronically.

## 2018-10-10 NOTE — Progress Notes (Signed)
covid and temp per [facility]

## 2018-10-10 NOTE — Progress Notes (Signed)
Called to room to assist during endoscopic procedure.  Patient ID and intended procedure confirmed with present staff. Received instructions for my participation in the procedure from the performing physician.  

## 2018-10-12 ENCOUNTER — Telehealth: Payer: Self-pay | Admitting: *Deleted

## 2018-10-12 NOTE — Telephone Encounter (Signed)
1. Have you developed a fever since your procedure? no  2.   Have you had an respiratory symptoms (SOB or cough) since your procedure? no  3.   Have you tested positive for COVID 19 since your procedure no  4.   Have you had any family members/close contacts diagnosed with the COVID 19 since your procedure?  no   If any of these questions are a yes, please inquire if patient has been seen by family doctor and route this note to Joylene John, Therapist, sports.    Follow up Call-  Call back number 10/10/2018  Post procedure Call Back phone  # [phone]  Permission to leave phone message Yes  Some recent data might be hidden     Patient questions:  Do you have a fever, pain , or abdominal swelling? No. Pain Score  0 *  Have you tolerated food without any problems? Yes.    Have you been able to return to your normal activities? Yes.    Do you have any questions about your discharge instructions: Diet   No. Medications  No. Follow up visit  No.  Do you have questions or concerns about your Care? No.  Actions: * If pain score is 4 or above: No action needed, pain <4.

## 2018-10-17 ENCOUNTER — Encounter: Payer: Self-pay | Admitting: Internal Medicine

## 2018-10-17 DIAGNOSIS — Z86010 Personal history of colonic polyps: Secondary | ICD-10-CM

## 2018-10-17 NOTE — Progress Notes (Signed)
Rectal hyperplastc x 2 and 1 polyp lost Prior diminutive adenomas and elderly father had [facility]CA Recall 2025-7 Send letter in 2025

## 2018-11-14 ENCOUNTER — Encounter: Payer: Self-pay | Admitting: Internal Medicine

## 2019-03-05 ENCOUNTER — Other Ambulatory Visit: Payer: Self-pay

## 2019-03-05 DIAGNOSIS — Z20822 Contact with and (suspected) exposure to covid-19: Secondary | ICD-10-CM

## 2019-03-06 LAB — NOVEL CORONAVIRUS, NAA: SARS-CoV-2, NAA: NOT DETECTED

## 2019-06-06 ENCOUNTER — Other Ambulatory Visit: Payer: Self-pay

## 2019-06-06 ENCOUNTER — Emergency Department (HOSPITAL_COMMUNITY)
Admission: EM | Admit: 2019-06-06 | Discharge: 2019-06-07 | Disposition: A | Discharge status: Home / Self Care | Payer: Medicare PPO | Attending: Emergency Medicine | Admitting: Emergency Medicine

## 2019-06-06 ENCOUNTER — Encounter (HOSPITAL_COMMUNITY): Payer: Self-pay | Admitting: Emergency Medicine

## 2019-06-06 DIAGNOSIS — J189 Pneumonia, unspecified organism: Secondary | ICD-10-CM

## 2019-06-06 DIAGNOSIS — Z20822 Contact with and (suspected) exposure to covid-19: Secondary | ICD-10-CM | POA: Insufficient documentation

## 2019-06-06 DIAGNOSIS — Z79899 Other long term (current) drug therapy: Secondary | ICD-10-CM | POA: Diagnosis not present

## 2019-06-06 DIAGNOSIS — Z7982 Long term (current) use of aspirin: Secondary | ICD-10-CM | POA: Diagnosis not present

## 2019-06-06 DIAGNOSIS — N179 Acute kidney failure, unspecified: Secondary | ICD-10-CM | POA: Diagnosis not present

## 2019-06-06 DIAGNOSIS — R509 Fever, unspecified: Secondary | ICD-10-CM | POA: Diagnosis present

## 2019-06-06 LAB — CBC WITH DIFFERENTIAL/PLATELET
Abs Immature Granulocytes: 0.05 10*3/uL (ref 0.00–0.07)
Basophils Absolute: 0.1 10*3/uL (ref 0.0–0.1)
Basophils Relative: 1 %
Eosinophils Absolute: 0.2 10*3/uL (ref 0.0–0.5)
Eosinophils Relative: 2 %
HCT: 43.3 % (ref 39.0–52.0)
Hemoglobin: 14.2 g/dL (ref 13.0–17.0)
Immature Granulocytes: 1 %
Lymphocytes Relative: 12 %
Lymphs Abs: 1.2 10*3/uL (ref 0.7–4.0)
MCH: 29.8 pg (ref 26.0–34.0)
MCHC: 32.8 g/dL (ref 30.0–36.0)
MCV: 90.8 fL (ref 80.0–100.0)
Monocytes Absolute: 1.3 10*3/uL — ABNORMAL HIGH (ref 0.1–1.0)
Monocytes Relative: 13 %
Neutro Abs: 7.3 10*3/uL (ref 1.7–7.7)
Neutrophils Relative %: 71 %
Platelets: 331 10*3/uL (ref 150–400)
RBC: 4.77 MIL/uL (ref 4.22–5.81)
RDW: 15.1 % (ref 11.5–15.5)
WBC: 10 10*3/uL (ref 4.0–10.5)
nRBC: 0 % (ref 0.0–0.2)

## 2019-06-06 LAB — COMPREHENSIVE METABOLIC PANEL
ALT: 37 U/L (ref 0–44)
AST: 41 U/L (ref 15–41)
Albumin: 3.7 g/dL (ref 3.5–5.0)
Alkaline Phosphatase: 49 U/L (ref 38–126)
Anion gap: 12 (ref 5–15)
BUN: 22 mg/dL (ref 8–23)
CO2: 21 mmol/L — ABNORMAL LOW (ref 22–32)
Calcium: 9.2 mg/dL (ref 8.9–10.3)
Chloride: 105 mmol/L (ref 98–111)
Creatinine, Ser: 1.69 mg/dL — ABNORMAL HIGH (ref 0.61–1.24)
GFR calc Af Amer: 47 mL/min — ABNORMAL LOW (ref >60)
GFR calc non Af Amer: 41 mL/min — ABNORMAL LOW (ref >60)
Glucose, Bld: 122 mg/dL — ABNORMAL HIGH (ref 70–99)
Potassium: 4.3 mmol/L (ref 3.5–5.1)
Sodium: 138 mmol/L (ref 135–145)
Total Bilirubin: 1.1 mg/dL (ref 0.3–1.2)
Total Protein: 6.4 g/dL — ABNORMAL LOW (ref 6.5–8.1)

## 2019-06-06 NOTE — ED Triage Notes (Signed)
Pt states for the past 2 weeks he is spiking fever every night around 102, per pt he had multiple Covid test negative so far, pt is having some vomiting with his temps go up. Pt took some tylenol cold and flu at 6:30 today.

## 2019-06-07 ENCOUNTER — Emergency Department (HOSPITAL_COMMUNITY): Payer: Medicare PPO

## 2019-06-07 LAB — RESPIRATORY PANEL BY RT PCR (FLU A&B, COVID)
Influenza A by PCR: NEGATIVE
Influenza B by PCR: NEGATIVE
SARS Coronavirus 2 by RT PCR: NEGATIVE

## 2019-06-07 LAB — URINALYSIS, ROUTINE W REFLEX MICROSCOPIC
Bilirubin Urine: NEGATIVE
Glucose, UA: NEGATIVE mg/dL
Hgb urine dipstick: NEGATIVE
Ketones, ur: 5 mg/dL — AB
Leukocytes,Ua: NEGATIVE
Nitrite: NEGATIVE
Protein, ur: 30 mg/dL — AB
Specific Gravity, Urine: 1.028 (ref 1.005–1.030)
pH: 5 (ref 5.0–8.0)

## 2019-06-07 LAB — LACTIC ACID, PLASMA: Lactic Acid, Venous: 1.4 mmol/L (ref 0.5–1.9)

## 2019-06-07 LAB — HIV ANTIBODY (ROUTINE TESTING W REFLEX): HIV Screen 4th Generation wRfx: NONREACTIVE

## 2019-06-07 MED ORDER — LEVOFLOXACIN 500 MG PO TABS: 500.0000 mg | ORAL_TABLET | Freq: Every day | ORAL | 0 refills | Status: DC

## 2019-06-07 MED ORDER — LEVOFLOXACIN 750 MG PO TABS
750.0000 mg | ORAL_TABLET | Freq: Once | ORAL | Status: AC
  Administered 2019-06-07: 750 mg via ORAL
  Filled 2019-06-07: qty 1

## 2019-06-07 MED ORDER — SODIUM CHLORIDE 0.9 % IV BOLUS (SEPSIS)
1000.0000 mL | Freq: Once | INTRAVENOUS | Status: AC
  Administered 2019-06-07: 1000 mL via INTRAVENOUS

## 2019-06-07 NOTE — ED Notes (Signed)
Pt. Ambulated in room SpO2 maintained between 93-95% on room air

## 2019-06-07 NOTE — ED Provider Notes (Addendum)
TIME SEEN: 1:44 AM  CHIEF COMPLAINT: Fever  HPI: Patient is a 69 year old male with no significant past medical history who presents to the emergency department with fever for 2 weeks.  States it was high as 102 orally tonight.  He did have one episode of nonbloody, nonbilious vomiting tonight.  He has had some intermittent dry cough.  Has had 4 negative Covid tests over the past 2 weeks and a negative flu test.  He has had a flu vaccination.  Has had mild sinus headache.  No neck pain or neck stiffness.  No sore throat.  No chest pain or shortness of breath.  No abdominal pain, diarrhea.  No dysuria, hematuria, discharge.  No rash, extremity pain.  No sick contacts, Covid exposures.  No travel.  States he was on methotrexate for "an eye problem" but this medication was stopped a week ago.  Does not take any other immunosuppressants.  No history of HIV.  He is not a diabetic.  ROS: See HPI Constitutional:  fever  Eyes: no drainage  ENT: no runny nose   Cardiovascular:  no chest pain  Resp: no SOB  GI:  Vomiting x 1 GU: no dysuria Integumentary: no rash  Allergy: no hives  Musculoskeletal: no leg swelling  Neurological: no slurred speech ROS otherwise negative  PAST MEDICAL HISTORY/PAST SURGICAL HISTORY:  Past Medical History:  Diagnosis Date  . Anxiety   . Colon polyps 2003   Adenomatous polyp  . Diverticulosis of colon (without mention of hemorrhage) 2008  . Generalized anxiety disorder   . GERD (gastroesophageal reflux disease)   . Obesity   . OSA (obstructive sleep apnea)    CPAP    MEDICATIONS:  Prior to Admission medications   Medication Sig Start Date End Date Taking? Authorizing Provider  aspirin 81 MG tablet Take 81 mg by mouth daily.    [provider]  escitalopram (LEXAPRO) 10 MG tablet Take 1 tablet by mouth daily. 09/26/13   [provider]  olmesartan (BENICAR) 40 MG tablet Take 40 mg by mouth daily. 05/07/18   [provider]     ALLERGIES:  Allergies  Allergen Reactions  . Penicillins     SOCIAL HISTORY:  Social History   Tobacco Use  . Smoking status: Never Smoker  . Smokeless tobacco: Never Used  Substance Use Topics  . Alcohol use: No    FAMILY HISTORY: Family History  Problem Relation Age of Onset  . Colon cancer Father 46  . Heart attack Father   . Heart failure Mother     EXAM: BP 107/73 (BP Location: Left Arm)   Pulse (!) 119   Temp 99.6 F (37.6 C) (Oral)   Resp 20   Ht 5\' 8"  (1.727 m)   Wt 90.7 kg   SpO2 97%   BMI 30.41 kg/m  CONSTITUTIONAL: Alert and oriented and responds appropriately to questions. Well-appearing; well-nourished, nontoxic-appearing HEAD: Normocephalic EYES: Conjunctivae clear, pupils appear equal, EOM appear intact ENT: normal nose; moist mucous membranes NECK: Supple, normal ROM CARD: Regular and tachycardic; S1 and S2 appreciated; no murmurs, no clicks, no rubs, no gallops RESP: Normal chest excursion without splinting or tachypnea; breath sounds clear and equal bilaterally; no wheezes, no rhonchi, no rales, no hypoxia or respiratory distress, speaking full sentences ABD/GI: Normal bowel sounds; non-distended; soft, non-tender, no rebound, no guarding, no peritoneal signs, no hepatosplenomegaly BACK:  The back appears normal without lesions EXT: Normal ROM in all joints; no deformity noted, no edema;  no cyanosis SKIN: Normal color for age and race; warm; no rash on exposed skin, no signs of cellulitis or abscess NEURO: Moves all extremities equally, normal speech PSYCH: The patient's mood and manner are appropriate.   MEDICAL DECISION MAKING: Patient here with fevers for 2 weeks.  Has had multiple negative Covid screens as well as a negative flu test.  Labs here showed no leukocytosis or leukopenia.  He does have mildly elevated creatinine of 1.69 with no old for comparison.  States he feels dehydrated.  Will give IV fluids.  Urine shows no sign of  infection.  Will obtain chest x-ray, lactate, blood cultures, urine culture, screen for HIV, Covid and flu tests.  He is mildly tachycardic here but meets no other criteria for SIRS or sepsis.  He is well-appearing, nontoxic.  ED PROGRESS: Chest x-ray shows multifocal pneumonia.  Patient has a severe penicillin allergy.  Will start on Levaquin 500 mg for 5 days.  Lactate normal.  Blood and urine cultures pending.  Covid and flu test negative.  HIV antibody negative.  Able to ambulate throughout the room without any hypoxia.  He has had no respiratory distress, increased work of breathing.  I feel he is safe to be discharged with close outpatient follow-up.  He is also comfortable with this plan.  We did discuss the findings of his mildly elevated creatinine here and have recommended continue increase fluid intake and to avoid NSAIDs.  He will have this rechecked by his PCP.  He denies any known history of chronic kidney disease.   Confirmed with pharmacist Jeneen Rinks that Levaquin 500 mg once daily for 5 days would be appropriate dosing given his renal function.   At this time, I do not feel there is any life-threatening condition present. I have reviewed, interpreted and discussed all results (EKG, imaging, lab, urine as appropriate) and exam findings with patient/family. I have reviewed nursing notes and appropriate previous records.  I feel the patient is safe to be discharged home without further emergent workup and can continue workup as an outpatient as needed. Discussed usual and customary return precautions. Patient/family verbalize understanding and are comfortable with this plan.  Outpatient follow-up has been provided as needed. All questions have been answered.     EKG Interpretation  Date/Time:  Saturday June 07 2019 02:21:19 EST Ventricular Rate:  86 PR Interval:    QRS Duration: 88 QT Interval:  380 QTC Calculation: 455 R Axis:   33 Text Interpretation: Sinus rhythm Low voltage,  extremity and precordial leads No old tracing to compare Confirmed by Draedyn Weidinger, Cyril Mourning [phone]) on 06/07/2019 2:27:41 AM          CRITICAL CARE Performed by: Cyril Mourning Emmilee Reamer   Total critical care time: 40 minutes  Critical care time was exclusive of separately billable procedures and treating other patients.  Critical care was necessary to treat or prevent imminent or life-threatening deterioration.  Critical care was time spent personally by me on the following activities: development of treatment plan with patient and/or surrogate as well as nursing, discussions with consultants, evaluation of patient's response to treatment, examination of patient, obtaining history from patient or surrogate, ordering and performing treatments and interventions, ordering and review of laboratory studies, ordering and review of radiographic studies, pulse oximetry and re-evaluation of patient's condition.   Hasker Dunston was evaluated in Emergency Department on 06/07/2019 for the symptoms described in the history of present illness. He was evaluated in the context of the global COVID-19 pandemic,  which necessitated consideration that the patient might be at risk for infection with the SARS-CoV-2 virus that causes COVID-19. Institutional protocols and algorithms that pertain to the evaluation of patients at risk for COVID-19 are in a state of rapid change based on information released by regulatory bodies including the CDC and federal and state organizations. These policies and algorithms were followed during the patient's care in the ED.  Patient was seen wearing N95, face shield, gloves, gown.    [place] Sitzmann, Delice Bison, DO 06/07/19 [place], Delice Bison, DO 06/07/19 [phone]

## 2019-06-07 NOTE — ED Notes (Signed)
Discharge instructions discussed with pt. Pt verbalized understanding with no questions at this time. Pt to go home with wife 

## 2019-06-07 NOTE — Discharge Instructions (Signed)
Your chest x-ray showed signs of pneumonia in both lungs.  We are starting you on an antibiotic called Levaquin.  You have received your first dose this morning and will not need to take it again until the morning of 06/08/2019.  Your kidney function was also slightly elevated today at 1.69.  We have no previous labs to compare this to.  I recommend you increase your fluid intake at home and avoid NSAIDs such as ibuprofen, aspirin and Aleve.  It is safe to take Tylenol 1000 mg every 6 hours as needed for fever and pain.  Please follow-up with your primary care physician in 1 week to have this blood test rechecked.  Otherwise your labs, urine, EKG today were normal.  You may follow-up on these results through MyChart.

## 2019-06-08 LAB — URINE CULTURE: Culture: NO GROWTH

## 2019-06-12 LAB — CULTURE, BLOOD (ROUTINE X 2)
Culture: NO GROWTH
Culture: NO GROWTH
Special Requests: ADEQUATE

## 2019-06-21 ENCOUNTER — Ambulatory Visit: Payer: Medicare PPO

## 2019-06-23 ENCOUNTER — Other Ambulatory Visit: Payer: Self-pay

## 2019-06-23 ENCOUNTER — Ambulatory Visit: Payer: Medicare PPO | Admitting: Pulmonary Disease

## 2019-06-23 ENCOUNTER — Encounter: Payer: Self-pay | Admitting: Pulmonary Disease

## 2019-06-23 VITALS — BP 118/72 | HR 92 | Temp 97.0°F | Ht 68.0 in | Wt 211.8 lb

## 2019-06-23 DIAGNOSIS — R0600 Dyspnea, unspecified: Secondary | ICD-10-CM | POA: Diagnosis not present

## 2019-06-23 NOTE — Patient Instructions (Addendum)
I am glad you are starting to feel better His symptoms are likely from pneumonia.  Methotrexate can present like this as well We will schedule high-resolution CT of the chest around March to reevaluate your lungs Please give [place] a call sooner if there is any change in your symptoms We will reevaluate in clinic after CT scan.

## 2019-06-23 NOTE — Progress Notes (Signed)
Jorge Burton    [id number]    1951-02-24  Primary Care Physician:Shaw, Gwyndolyn Saxon, MD  Referring Physician: [name], MD [address] [place],  [place] [address]  Chief complaint: Consult for cough, respiratory failure and abnormal x-ray  HPI: 69 year old with history of anxiety, GERD, OSA on CPAP  Evaluated in the ED and his primary care doctor multiple times for acute respiratory failure with lung imaging showing bilateral infiltrates, pneumonitis.  He has been tested multiple times for Covid which were all negative.  Treated with antibiotics including cefdinir, azithromycin as an outpatient.  He was also given Levaquin in ED on 06/07/2019 but could not tolerate it due to itching.  He is followed 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  Initially on prednisone started on 10/04/2018 which is tapered off.  On methotrexate from 10/18/2018.  This was held in early January when he developed respiratory symptoms.  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  Currently works at a [employer] Exposures: No known exposures.  No mold, hot tub, Jacuzzi, no feather pillows or comforter Smoking history: Never smoked Travel history: No significant travel history Relevant family history: No significant family history of lung disease   Outpatient Encounter Medications as of 06/23/2019  Medication Sig  . ascorbic acid (VITAMIN C) 500 MG tablet Take 500 mg by mouth daily.  Marland Kitchen aspirin 81 MG tablet Take 81 mg by mouth daily.  . cholecalciferol (VITAMIN D3) 25 MCG (1000 UNIT) tablet Take 1,000 Units by mouth daily.  Marland Kitchen escitalopram (LEXAPRO) 10 MG tablet Take 10 mg by mouth daily.   . folic acid (FOLVITE) 1 MG tablet Take 1 mg by mouth daily.  Marland Kitchen olmesartan (BENICAR) 40 MG tablet Take  40 mg by mouth daily.  Marland Kitchen zinc gluconate 50 MG tablet Take 50 mg by mouth daily.  . methotrexate (RHEUMATREX) 2.5 MG tablet Take 20 mg by mouth once a week.  . [DISCONTINUED] levofloxacin (LEVAQUIN) 500 MG tablet Take 1 tablet (500 mg total) by mouth daily.   No facility-administered encounter medications on file as of 06/23/2019.    Allergies as of 06/23/2019 - Review Complete 06/23/2019  Allergen Reaction Noted  . Penicillins Anaphylaxis and Swelling 08/10/2007    Past Medical History:  Diagnosis Date  . Anxiety   . Colon polyps 2003   Adenomatous polyp  . Diverticulosis of colon (without mention of hemorrhage) 2008  . Generalized anxiety disorder   . GERD (gastroesophageal reflux disease)   . Obesity   . OSA (obstructive sleep apnea)    CPAP    Past Surgical History:  Procedure Laterality Date  . COLONOSCOPY     mulitple   . EYE EXAMINATION UNDER ANESTHESIA Right 07/29/2018   Procedure: INTRA-VITREOUS LOCALIZATION;  Surgeon: Jalene Mullet, MD;  Location: [facility];  Service: Ophthalmology;  Laterality: Right;  . EYE SURGERY     right eye - cataract   . WRIST SURGERY     left     Family History  Problem Relation Age of Onset  . Colon cancer Father 32  . Heart attack Father   . Heart failure Mother     Social History   Socioeconomic History  . Marital status: Married    Spouse name: Not on file  . Number of children:  2  . Years of education: Not on file  . Highest education level: Not on file  Occupational History  . Occupation: Pharmacist, hospital- retired    Comment: Administrator  . Occupation: professor -retired    Fish farm manager: [employer]  . Occupation: Aeronautical engineer: [employer]  Tobacco Use  . Smoking status: Never Smoker  . Smokeless tobacco: Never Used  Substance and Sexual Activity  . Alcohol use: No  . Drug use: No  . Sexual activity: Not on file  Other Topics Concern  . 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 Difficulty of Paying Living Expenses: Not on file  Food Insecurity:   . Worried About Charity fundraiser in the Last Year: Not on file  . Ran Out of Food in the Last Year: Not on file  Transportation Needs:   . Lack of Transportation (Medical): Not on file  . Lack of Transportation (Non-Medical): Not on file  Physical Activity:   . Days of Exercise per Week: Not on file  . Minutes of Exercise per Session: Not on file  Stress:   . Feeling of Stress : Not on file  Social Connections:   . Frequency of Communication with Friends and Family: Not on file  . Frequency of Social Gatherings with Friends and Family: Not on file  . Attends Religious Services: Not on file  . Active Member of Clubs or Organizations: Not on file  . Attends Archivist Meetings: Not on file  . Marital Status: Not on file  Intimate Partner Violence:   . Fear of Current or Ex-Partner: Not on file  . Emotionally Abused: Not on file  . Physically Abused: Not on file  . Sexually Abused: Not on file    Review of systems: Review of Systems  Constitutional: Negative for fever and chills.  HENT: Negative.   Eyes: Negative for blurred vision.  Respiratory: as per HPI  Cardiovascular: Negative for chest pain and palpitations.  Gastrointestinal: Negative for vomiting, diarrhea, blood per rectum. Genitourinary: Negative for dysuria, urgency, frequency and hematuria.  Musculoskeletal: Negative for myalgias, back pain and joint pain.  Skin: Negative for itching and rash.   Neurological: Negative for dizziness, tremors, focal weakness, seizures and loss of consciousness.  Endo/Heme/Allergies: Negative for environmental allergies.  Psychiatric/Behavioral: Negative for depression, suicidal ideas and hallucinations.  All other systems reviewed and are negative.  Physical Exam: Blood pressure 118/72, pulse 92, temperature (!) 97 F (36.1 C), temperature source Temporal, height '5\' 8"'  (1.727 m), weight 211 lb 12.8 oz (96.1 kg), SpO2 98 %. Gen:      No acute distress HEENT:  EOMI, sclera anicteric Neck:     No masses; no thyromegaly Lungs:    Clear to auscultation bilaterally; normal respiratory effort CV:         Regular rate and rhythm; no murmurs Abd:      + bowel sounds; soft, non-tender; no palpable masses, no distension Ext:    No edema; adequate peripheral perfusion  Skin:      Warm and dry; no rash Neuro: alert and oriented x 3 Psych: normal mood and affect  Data Reviewed: Imaging: Chest x-ray 06/07/2019-patchy bilateral airspace opacities.  I have reviewed the images personally.  X-ray report from primary care Chest x-ray 06/12/2019-mild hyperexpansion, clear lungs with no infiltrate Chest x-ray 05/29/2019-increased interstitial markings consistent with pneumonitis next  Labs: Labs from primary care 8/52/[id number] metabolic panel-within normal limits CBC-WBC 9.08, eos 4.2%, absolute eosinophil count 381  Assessment:  Evaluation for respiratory failure with bilateral pulmonary infiltrates Tested negative for Covid multiple times.  Likely had a community-acquired pneumonia.  Symptoms have improved after antibiotic treatment with normal chest x-ray recently at primary care.  Methotrexate induced interstitial lung disease also possible.  Currently off methotrexate and would advise him to stay off it if possible  Order high-resolution CT for evaluation of the lungs to evaluate underlying ILD.  We will schedule that 4 to 8 weeks out from now to  ensure clearance of pneumonia  Plan/Recommendations: - High-resolution CT  Marshell Garfinkel MD [facility] 06/23/2019, 9:46 AM  CC: [name], MD

## 2019-06-26 ENCOUNTER — Ambulatory Visit: Payer: Medicare PPO | Attending: Internal Medicine

## 2019-06-26 ENCOUNTER — Ambulatory Visit: Payer: Medicare PPO

## 2019-06-26 DIAGNOSIS — Z23 Encounter for immunization: Secondary | ICD-10-CM | POA: Insufficient documentation

## 2019-06-26 NOTE — Progress Notes (Signed)
   Covid-19 Vaccination Clinic  Name:  Jorge Burton    MRN: [id number] DOB: 17-Mar-1951  06/26/2019  Jorge Burton was observed post Covid-19 immunization for 15 minutes without incidence. He was provided with Vaccine Information Sheet and instruction to access the V-Safe system.   Jorge Burton was instructed to call 911 with any severe reactions post vaccine: Marland Kitchen Difficulty breathing  . Swelling of your face and throat  . A fast heartbeat  . A bad rash all over your body  . Dizziness and weakness    Immunizations Administered    Name Date Dose VIS Date Route   Pfizer COVID-19 Vaccine 06/26/2019  8:37 AM 0.3 mL 05/02/2019 Intramuscular   Manufacturer: [place]   Lot: [id number]   [place]: [id number]

## 2019-07-21 ENCOUNTER — Ambulatory Visit: Payer: Medicare PPO | Attending: Internal Medicine

## 2019-07-21 DIAGNOSIS — Z23 Encounter for immunization: Secondary | ICD-10-CM | POA: Insufficient documentation

## 2019-07-21 NOTE — Progress Notes (Signed)
   Covid-19 Vaccination Clinic  Name:  Jorge Burton    MRN: [id number] DOB: 1950/10/19  07/21/2019  Mr. [name] was observed post Covid-19 immunization for 15 minutes without incidence. He was provided with Vaccine Information Sheet and instruction to access the V-Safe system.   Mr. [name] was instructed to call 911 with any severe reactions post vaccine: Marland Kitchen Difficulty breathing  . Swelling of your face and throat  . A fast heartbeat  . A bad rash all over your body  . Dizziness and weakness    Immunizations Administered    Name Date Dose VIS Date Route   Pfizer COVID-19 Vaccine 07/21/2019  8:22 AM 0.3 mL 05/02/2019 Intramuscular   Manufacturer: [place]   Lot: [id number]   [place]: [id number]

## 2019-08-11 ENCOUNTER — Other Ambulatory Visit: Payer: Self-pay

## 2019-08-11 ENCOUNTER — Ambulatory Visit (INDEPENDENT_AMBULATORY_CARE_PROVIDER_SITE_OTHER)
Admission: RE | Admit: 2019-08-11 | Discharge: 2019-08-11 | Disposition: A | Discharge status: Home / Self Care | Payer: Medicare PPO | Source: Ambulatory Visit | Attending: Pulmonary Disease | Admitting: Pulmonary Disease

## 2019-08-11 DIAGNOSIS — R0600 Dyspnea, unspecified: Secondary | ICD-10-CM | POA: Diagnosis not present

## 2019-08-28 ENCOUNTER — Other Ambulatory Visit: Payer: Self-pay

## 2019-08-28 ENCOUNTER — Encounter: Payer: Self-pay | Admitting: Pulmonary Disease

## 2019-08-28 ENCOUNTER — Ambulatory Visit: Payer: Medicare PPO | Admitting: Pulmonary Disease

## 2019-08-28 DIAGNOSIS — R0602 Shortness of breath: Secondary | ICD-10-CM

## 2019-08-28 NOTE — Progress Notes (Signed)
Jorge Burton    [id number]    November 08, 1950  Primary Care Physician:Shaw, Gwyndolyn Saxon, MD  Referring Physician: [name], MD [address] [place],  [place] [address]  Chief complaint: Consult for cough, respiratory failure and abnormal x-ray  HPI: 69 year old with history of anxiety, GERD, OSA on CPAP  Evaluated in the ED and his primary care doctor multiple times for acute respiratory failure with lung imaging showing bilateral infiltrates, pneumonitis.  He has been tested multiple times for Covid which were all negative.  Treated with antibiotics including cefdinir, azithromycin as an outpatient.  He was also given Levaquin in ED on 06/07/2019 but could not tolerate it due to itching.  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  Initially on prednisone started on 10/04/2018 which is tapered off.  On methotrexate from 10/18/2018.  This was held in early January when he developed respiratory symptoms.  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  No mold, hot tub, Jacuzzi, no feather pillows or comforter Smoking history: Never smoked Travel history: No significant travel history Relevant family history: No significant family history of lung disease  Interim history: Feels well with no issues.  States that breathing is doing well  Outpatient Encounter Medications as of 08/28/2019  Medication Sig  . ascorbic acid (VITAMIN C) 500 MG tablet Take 500 mg by mouth daily.  Marland Kitchen aspirin 81 MG tablet Take 81 mg by mouth daily.  . cholecalciferol (VITAMIN D3) 25 MCG (1000 UNIT) tablet Take 1,000 Units by mouth daily.  Marland Kitchen escitalopram (LEXAPRO) 10 MG tablet Take 10 mg by mouth daily.   . folic acid  (FOLVITE) 1 MG tablet Take 1 mg by mouth daily.  . methotrexate (RHEUMATREX) 2.5 MG tablet Take 20 mg by mouth once a week.  . olmesartan (BENICAR) 40 MG tablet Take 40 mg by mouth daily.  Marland Kitchen zinc gluconate 50 MG tablet Take 50 mg by mouth daily.   No facility-administered encounter medications on file as of 08/28/2019.   Physical Exam: Blood pressure 120/80, pulse 78, temperature 98.2 F (36.8 C), temperature source Temporal, height 5' 8" (1.727 m), weight 223 lb 9.6 oz (101.4 kg), SpO2 96 %. Gen:      No acute distress HEENT:  EOMI, sclera anicteric Neck:     No masses; no thyromegaly Lungs:    Clear to auscultation bilaterally; normal respiratory effort CV:         Regular rate and rhythm; no murmurs Abd:      + bowel sounds; soft, non-tender; no palpable masses, no distension Ext:    No edema; adequate peripheral perfusion Skin:      Warm and dry; no rash Neuro: alert and oriented x 3 Psych: normal mood and affect  Data Reviewed: Imaging: Chest x-ray 06/07/2019-patchy bilateral airspace opacities.  I have reviewed the images personally.  X-ray report from primary care Chest x-ray 06/12/2019-mild hyperexpansion, clear lungs with no infiltrate Chest x-ray 05/29/2019-increased interstitial markings consistent with pneumonitis   High-resolution CT 08/11/2019-mild upper lobe bronchiectasis, air-trapping, 4 mm lung nodule.  Labs: Labs from primary care 1/91/[id number] metabolic panel-within normal limits CBC-WBC 9.08, eos 4.2%, absolute eosinophil count 381  Assessment:  Evaluation for respiratory failure with bilateral pulmonary  infiltrates Tested negative for Covid multiple times.  Likely had a community-acquired pneumonia.  Symptoms have improved after antibiotic treatment with normal chest x-ray recently at primary care.  Methotrexate induced interstitial lung disease also possible.  Currently off methotrexate and would advise him to stay off it if possible  CT scan reviewed  with upper lobe bronchiectasis which is likely postinflammatory.  Could be secondary to methotrexate though it is not typical. He is doing well clinically with no respiratory issues.  Continue monitoring Repeat high-res CT in 1 year  Lung nodules Follow-up on repeat CT  Plan/Recommendations: - High-resolution CT in 1 year  Praveen Mannam MD [facility] 08/28/2019, 10:26 AM  CC: Shaw, William, MD     

## 2019-08-28 NOTE — Patient Instructions (Signed)
I have reviewed the CT scan which shows mild changes of called bronchiectasis which could be a result of your prior pneumonia Advise you to continue to be off methotrexate We will get a high-resolution CT in 1 year for follow-up Follow-up in clinic after scan Please call sooner if there is any change in your symptoms

## 2019-09-11 ENCOUNTER — Ambulatory Visit: Payer: Medicare PPO | Admitting: Pulmonary Disease

## 2019-09-19 DIAGNOSIS — Z961 Presence of intraocular lens: Secondary | ICD-10-CM | POA: Diagnosis not present

## 2019-09-19 DIAGNOSIS — H2512 Age-related nuclear cataract, left eye: Secondary | ICD-10-CM | POA: Diagnosis not present

## 2019-09-19 DIAGNOSIS — H30031 Focal chorioretinal inflammation, peripheral, right eye: Secondary | ICD-10-CM | POA: Diagnosis not present

## 2019-09-19 DIAGNOSIS — H3581 Retinal edema: Secondary | ICD-10-CM | POA: Diagnosis not present

## 2019-09-19 DIAGNOSIS — Z79899 Other long term (current) drug therapy: Secondary | ICD-10-CM | POA: Diagnosis not present

## 2019-09-19 DIAGNOSIS — H35033 Hypertensive retinopathy, bilateral: Secondary | ICD-10-CM | POA: Diagnosis not present

## 2020-01-06 DIAGNOSIS — R361 Hematospermia: Secondary | ICD-10-CM | POA: Diagnosis not present

## 2020-01-06 DIAGNOSIS — R6882 Decreased libido: Secondary | ICD-10-CM | POA: Diagnosis not present

## 2020-02-03 DIAGNOSIS — I10 Essential (primary) hypertension: Secondary | ICD-10-CM | POA: Diagnosis not present

## 2020-02-03 DIAGNOSIS — M859 Disorder of bone density and structure, unspecified: Secondary | ICD-10-CM | POA: Diagnosis not present

## 2020-02-03 DIAGNOSIS — Z125 Encounter for screening for malignant neoplasm of prostate: Secondary | ICD-10-CM | POA: Diagnosis not present

## 2020-02-04 DIAGNOSIS — L820 Inflamed seborrheic keratosis: Secondary | ICD-10-CM | POA: Diagnosis not present

## 2020-02-04 DIAGNOSIS — D225 Melanocytic nevi of trunk: Secondary | ICD-10-CM | POA: Diagnosis not present

## 2020-02-04 DIAGNOSIS — L821 Other seborrheic keratosis: Secondary | ICD-10-CM | POA: Diagnosis not present

## 2020-02-04 DIAGNOSIS — D1801 Hemangioma of skin and subcutaneous tissue: Secondary | ICD-10-CM | POA: Diagnosis not present

## 2020-02-10 DIAGNOSIS — R7301 Impaired fasting glucose: Secondary | ICD-10-CM | POA: Diagnosis not present

## 2020-02-10 DIAGNOSIS — I10 Essential (primary) hypertension: Secondary | ICD-10-CM | POA: Diagnosis not present

## 2020-02-10 DIAGNOSIS — M8580 Other specified disorders of bone density and structure, unspecified site: Secondary | ICD-10-CM | POA: Diagnosis not present

## 2020-02-10 DIAGNOSIS — G4733 Obstructive sleep apnea (adult) (pediatric): Secondary | ICD-10-CM | POA: Diagnosis not present

## 2020-02-10 DIAGNOSIS — R82998 Other abnormal findings in urine: Secondary | ICD-10-CM | POA: Diagnosis not present

## 2020-02-10 DIAGNOSIS — Z0000 Encounter for general adult medical examination without abnormal findings: Secondary | ICD-10-CM | POA: Diagnosis not present

## 2020-02-10 DIAGNOSIS — E669 Obesity, unspecified: Secondary | ICD-10-CM | POA: Diagnosis not present

## 2020-02-10 DIAGNOSIS — Z86010 Personal history of colonic polyps: Secondary | ICD-10-CM | POA: Diagnosis not present

## 2020-02-10 DIAGNOSIS — F411 Generalized anxiety disorder: Secondary | ICD-10-CM | POA: Diagnosis not present

## 2020-02-10 DIAGNOSIS — N1831 Chronic kidney disease, stage 3a: Secondary | ICD-10-CM | POA: Diagnosis not present

## 2020-03-25 DIAGNOSIS — H43813 Vitreous degeneration, bilateral: Secondary | ICD-10-CM | POA: Diagnosis not present

## 2020-03-25 DIAGNOSIS — Z961 Presence of intraocular lens: Secondary | ICD-10-CM | POA: Diagnosis not present

## 2020-03-25 DIAGNOSIS — H524 Presbyopia: Secondary | ICD-10-CM | POA: Diagnosis not present

## 2020-03-25 DIAGNOSIS — H353112 Nonexudative age-related macular degeneration, right eye, intermediate dry stage: Secondary | ICD-10-CM | POA: Diagnosis not present

## 2020-04-10 ENCOUNTER — Ambulatory Visit: Payer: Medicare PPO | Attending: Internal Medicine

## 2020-04-10 DIAGNOSIS — Z23 Encounter for immunization: Secondary | ICD-10-CM

## 2020-04-10 NOTE — Progress Notes (Signed)
° °  Covid-19 Vaccination Clinic  Name:  Jorge Burton    MRN: [id number] DOB: 17-Oct-1950  04/10/2020  [name] was observed post Covid-19 immunization for 15 minutes without incident. He was provided with Vaccine Information Sheet and instruction to access the V-Safe system.   [name] was instructed to call 911 with any severe reactions post vaccine:  Difficulty breathing   Swelling of face and throat   A fast heartbeat   A bad rash all over body   Dizziness and weakness   Immunizations Administered    Name Date Dose VIS Date Elias-Fela Solis COVID-19 Vaccine 04/10/2020 10:25 AM 0.3 mL 03/10/2020 Intramuscular   Manufacturer: [place]   Lot: [id number]   [place]: [id number]

## 2020-08-27 ENCOUNTER — Other Ambulatory Visit: Payer: Self-pay

## 2020-08-27 ENCOUNTER — Ambulatory Visit (INDEPENDENT_AMBULATORY_CARE_PROVIDER_SITE_OTHER)
Admission: RE | Admit: 2020-08-27 | Discharge: 2020-08-27 | Disposition: A | Discharge status: Home / Self Care | Payer: Medicare PPO | Source: Ambulatory Visit | Attending: Pulmonary Disease | Admitting: Pulmonary Disease

## 2020-08-27 DIAGNOSIS — R0602 Shortness of breath: Secondary | ICD-10-CM | POA: Diagnosis not present

## 2020-09-06 ENCOUNTER — Ambulatory Visit: Payer: Medicare PPO | Admitting: Pulmonary Disease

## 2020-09-06 ENCOUNTER — Other Ambulatory Visit: Payer: Self-pay

## 2020-09-06 ENCOUNTER — Encounter: Payer: Self-pay | Admitting: Pulmonary Disease

## 2020-09-06 VITALS — BP 126/70 | HR 83 | Temp 97.7°F | Ht 68.0 in | Wt 225.6 lb

## 2020-09-06 DIAGNOSIS — R0600 Dyspnea, unspecified: Secondary | ICD-10-CM

## 2020-09-06 NOTE — Patient Instructions (Signed)
I am glad you are doing well with regard to your breathing I have reviewed the CT scan which shows some mild changes in lung nodules which are not concerning.  He can follow-up in clinic as needed.

## 2020-09-06 NOTE — Progress Notes (Signed)
       Jorge Burton    [id number]    01/08/1951  Primary Care Physician:Shaw, William D Jr., MD  Referring Physician: Shaw, William D Jr., MD [address] [place],  [place] [address]  Chief complaint: Follow-up for cough, respiratory failure and abnormal x-ray  HPI: 69-year-old with history of anxiety, GERD, OSA on CPAP  Evaluated in the ED and his primary care doctor multiple times for acute respiratory failure with lung imaging showing bilateral infiltrates, pneumonitis.  He has been tested multiple times for Covid which were all negative.  Treated with antibiotics including cefdinir, azithromycin as an outpatient.  He was also given Levaquin in ED on 06/07/2019 but could not tolerate it due to itching.  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  Initially on prednisone started on 10/04/2018 which is tapered off.  On methotrexate from 10/18/2018.  This was held in early January when he developed respiratory symptoms.  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  No mold, hot tub, Jacuzzi, no feather pillows or comforter Smoking history: Never smoked Travel history: No significant travel history Relevant family history: No significant family history of lung disease  Interim history: Here for review of CT.  States that he is doing well with no issues.  Continues work from home for [employer]  He was taken off methotrexate by his ophthalmologist at [facility] in May 2021.  He has not had any recurrence of eye inflammation since then.  Outpatient Encounter Medications as of 09/06/2020  Medication Sig  . aspirin 81 MG tablet Take 81 mg by mouth daily.  .  escitalopram (LEXAPRO) 10 MG tablet Take 10 mg by mouth daily.  . folic acid (FOLVITE) 1 MG tablet Take 1 mg by mouth daily.  . olmesartan (BENICAR) 40 MG tablet Take 40 mg by mouth daily.  . [DISCONTINUED] ascorbic acid (VITAMIN C) 500 MG tablet Take 500 mg by mouth daily.  . [DISCONTINUED] cholecalciferol (VITAMIN D3) 25 MCG (1000 UNIT) tablet Take 1,000 Units by mouth daily.  . [DISCONTINUED] escitalopram (LEXAPRO) 10 MG tablet Take 10 mg by mouth daily.   . [DISCONTINUED] methotrexate (RHEUMATREX) 2.5 MG tablet Take 20 mg by mouth once a week.  . [DISCONTINUED] zinc gluconate 50 MG tablet Take 50 mg by mouth daily.   No facility-administered encounter medications on file as of 09/06/2020.   Physical Exam: Blood pressure 126/70, pulse 83, temperature 97.7 F (36.5 C), temperature source Temporal, height 5' 8" (1.727 m), weight 225 lb 9.6 oz (102.3 kg), SpO2 98 %. Gen:      No acute distress HEENT:  EOMI, sclera anicteric Neck:     No masses; no thyromegaly Lungs:    Clear to auscultation bilaterally; normal respiratory effort CV:         Regular rate and rhythm; no murmurs Abd:      + bowel sounds; soft, non-tender; no palpable masses, no distension Ext:    No edema; adequate peripheral perfusion Skin:      Warm and dry; no rash Neuro: alert and oriented x 3 Psych: normal mood and affect  Data Reviewed: Imaging: Chest x-ray 06/07/2019-patchy bilateral airspace opacities.  I have reviewed the images personally.    X-ray report from primary care Chest x-ray 06/12/2019-mild hyperexpansion, clear lungs with no infiltrate Chest x-ray 05/29/2019-increased interstitial markings consistent with pneumonitis   High-resolution CT 08/11/2019-mild upper lobe bronchiectasis, air-trapping, 4 mm lung nodule.  High-res CT 08/27/2020-stable mild upper lobe bronchiectasis, stable lung nodule I have reviewed the images personally  Labs: Labs from primary care 11/08/5091-[id number] metabolic  panel-within normal limits CBC-WBC 9.08, eos 4.2%, absolute eosinophil count 381  Assessment:  Respiratory failure with bilateral pulmonary infiltrates in early 2021 Tested negative for Covid multiple times.  Likely had a community-acquired pneumonia.  Symptoms have improved after antibiotic treatment with normal chest x-ray recently at primary care.  Methotrexate induced interstitial lung disease also possible.  Currently off methotrexate  CT scan reviewed with upper lobe bronchiectasis which is likely postinflammatory.  Could be secondary to methotrexate though it is not typical.  Follow-up CT reviewed with no significant changes He is doing well clinically with no respiratory issues.    Lung nodules Stable on repeat CT.  These are likely benign in a non-smoker.  No more follow-up needed  He is stable to be discharged from pulmonary clinic.    Plan/Recommendations: Follow-up in pulmonary clinic as needed  Marshell Garfinkel MD [facility] 09/06/2020, 9:55 AM  CC: Ginger Organ., MD

## 2020-11-10 ENCOUNTER — Emergency Department (HOSPITAL_COMMUNITY)
Admission: EM | Admit: 2020-11-10 | Discharge: 2020-11-10 | Disposition: A | Discharge status: Home / Self Care | Payer: Medicare PPO | Attending: Emergency Medicine | Admitting: Emergency Medicine

## 2020-11-10 ENCOUNTER — Encounter (HOSPITAL_COMMUNITY): Payer: Self-pay | Admitting: *Deleted

## 2020-11-10 ENCOUNTER — Emergency Department (HOSPITAL_COMMUNITY): Payer: Medicare PPO

## 2020-11-10 DIAGNOSIS — Z7982 Long term (current) use of aspirin: Secondary | ICD-10-CM | POA: Insufficient documentation

## 2020-11-10 DIAGNOSIS — K4020 Bilateral inguinal hernia, without obstruction or gangrene, not specified as recurrent: Secondary | ICD-10-CM | POA: Diagnosis not present

## 2020-11-10 DIAGNOSIS — K439 Ventral hernia without obstruction or gangrene: Secondary | ICD-10-CM | POA: Diagnosis not present

## 2020-11-10 DIAGNOSIS — N23 Unspecified renal colic: Secondary | ICD-10-CM

## 2020-11-10 DIAGNOSIS — N1330 Unspecified hydronephrosis: Secondary | ICD-10-CM | POA: Diagnosis not present

## 2020-11-10 DIAGNOSIS — R109 Unspecified abdominal pain: Secondary | ICD-10-CM | POA: Diagnosis present

## 2020-11-10 DIAGNOSIS — K219 Gastro-esophageal reflux disease without esophagitis: Secondary | ICD-10-CM | POA: Insufficient documentation

## 2020-11-10 DIAGNOSIS — R10A0 Flank pain, unspecified side: Secondary | ICD-10-CM

## 2020-11-10 DIAGNOSIS — N400 Enlarged prostate without lower urinary tract symptoms: Secondary | ICD-10-CM | POA: Diagnosis not present

## 2020-11-10 LAB — CBC
HCT: 44.9 % (ref 39.0–52.0)
Hemoglobin: 14.9 g/dL (ref 13.0–17.0)
MCH: 27.4 pg (ref 26.0–34.0)
MCHC: 33.2 g/dL (ref 30.0–36.0)
MCV: 82.5 fL (ref 80.0–100.0)
Platelets: 275 10*3/uL (ref 150–400)
RBC: 5.44 MIL/uL (ref 4.22–5.81)
RDW: 13.5 % (ref 11.5–15.5)
WBC: 15.9 10*3/uL — ABNORMAL HIGH (ref 4.0–10.5)
nRBC: 0 % (ref 0.0–0.2)

## 2020-11-10 LAB — BASIC METABOLIC PANEL
Anion gap: 8 (ref 5–15)
BUN: 14 mg/dL (ref 8–23)
CO2: 24 mmol/L (ref 22–32)
Calcium: 8.8 mg/dL — ABNORMAL LOW (ref 8.9–10.3)
Chloride: 101 mmol/L (ref 98–111)
Creatinine, Ser: 1.44 mg/dL — ABNORMAL HIGH (ref 0.61–1.24)
GFR, Estimated: 52 mL/min — ABNORMAL LOW (ref >60)
Glucose, Bld: 117 mg/dL — ABNORMAL HIGH (ref 70–99)
Potassium: 4 mmol/L (ref 3.5–5.1)
Sodium: 133 mmol/L — ABNORMAL LOW (ref 135–145)

## 2020-11-10 LAB — URINALYSIS, ROUTINE W REFLEX MICROSCOPIC
Bilirubin Urine: NEGATIVE
Glucose, UA: NEGATIVE mg/dL
Hgb urine dipstick: NEGATIVE
Ketones, ur: NEGATIVE mg/dL
Leukocytes,Ua: NEGATIVE
Nitrite: NEGATIVE
Protein, ur: NEGATIVE mg/dL
Specific Gravity, Urine: 1.028 (ref 1.005–1.030)
pH: 5 (ref 5.0–8.0)

## 2020-11-10 MED ORDER — MORPHINE SULFATE (PF) 4 MG/ML IV SOLN
4.0000 mg | Freq: Once | INTRAVENOUS | Status: DC
  Filled 2020-11-10: qty 1

## 2020-11-10 MED ORDER — ONDANSETRON 4 MG PO TBDP: 4.0000 mg | ORAL_TABLET | Freq: Three times a day (TID) | ORAL | 0 refills | Status: DC | PRN

## 2020-11-10 MED ORDER — HYDROCODONE-ACETAMINOPHEN 5-325 MG PO TABS: 2.0000 | ORAL_TABLET | ORAL | 0 refills | Status: DC | PRN

## 2020-11-10 MED ORDER — KETOROLAC TROMETHAMINE 30 MG/ML IJ SOLN
15.0000 mg | Freq: Once | INTRAMUSCULAR | Status: AC
Start: 2020-11-10 — End: 2020-11-10
  Administered 2020-11-10: 15 mg via INTRAVENOUS
  Filled 2020-11-10: qty 1

## 2020-11-10 MED ORDER — ONDANSETRON HCL 4 MG/2ML IJ SOLN
4.0000 mg | Freq: Once | INTRAMUSCULAR | Status: AC
  Administered 2020-11-10: 4 mg via INTRAVENOUS
  Filled 2020-11-10: qty 2

## 2020-11-10 MED ORDER — SODIUM CHLORIDE 0.9 % IV BOLUS
1000.0000 mL | Freq: Once | INTRAVENOUS | Status: AC
  Administered 2020-11-10: 1000 mL via INTRAVENOUS

## 2020-11-10 NOTE — Discharge Instructions (Addendum)
Return for any problem.  ?

## 2020-11-10 NOTE — ED Triage Notes (Signed)
Pt complains of right flank pain since last night, also has nausea. Hx of kidney stones, he's concerned it's a kidney stone.

## 2020-11-10 NOTE — ED Provider Notes (Signed)
[facility] DEPT Provider Note   CSN: [id number] Arrival date & time: 11/10/20  [id number]     History Chief Complaint  Patient presents with   Flank Pain    Jorge Burton is a 70 y.o. male.  70 year old male with prior medical history as detailed below presents for evaluation.  Patient reports onset of right-sided flank discomfort at night.  He reports pain there is similar to prior episodes of renal colic.  He denies fever.  He denies vomiting.  He denies bowel movement change.  Patient took ibuprofen at home with minimal improvement in symptoms.  He denies prior intervention for previous kidney stone.  The history is provided by the patient and medical records.  Flank Pain This is a new problem. The current episode started 12 to 24 hours ago. The problem occurs rarely. The problem has not changed since onset.Nothing aggravates the symptoms. Nothing relieves the symptoms.      Past Medical History:  Diagnosis Date   Anxiety    Colon polyps 2003   Adenomatous polyp   Diverticulosis of colon (without mention of hemorrhage) 2008   Generalized anxiety disorder    GERD (gastroesophageal reflux disease)    Obesity    OSA (obstructive sleep apnea)    CPAP    Patient Active Problem List   Diagnosis Date Noted   Prostate nodule 12/11/2013   Gastroesophageal reflux disease without esophagitis 12/11/2013   Stricture and stenosis of esophagus 12/11/2013   Personal history of colonic polyps 12/11/2013   OBSTRUCTIVE SLEEP APNEA 08/10/2007   LUMBAR SPRAIN AND STRAIN 08/10/2007    Past Surgical History:  Procedure Laterality Date   COLONOSCOPY     mulitple    EYE EXAMINATION UNDER ANESTHESIA Right 07/29/2018   Procedure: INTRA-VITREOUS LOCALIZATION;  Surgeon: Jalene Mullet, MD;  Location: [facility];  Service: Ophthalmology;  Laterality: Right;   EYE SURGERY     right eye - cataract    WRIST SURGERY     left        Family History  Problem  Relation Age of Onset   Colon cancer Father 61   Heart attack Father    Heart failure Mother     Social History   Tobacco Use   Smoking status: Never   Smokeless tobacco: Never  Vaping Use   Vaping Use: Never used  Substance Use Topics   Alcohol use: No   Drug use: No    Home Medications Prior to Admission medications   Medication Sig Start Date End Date Taking? Authorizing Provider  HYDROcodone-acetaminophen (NORCO/VICODIN) 5-325 MG tablet Take 2 tablets by mouth every 4 (four) hours as needed. 11/10/20  Yes Valarie Merino, MD  ondansetron (ZOFRAN ODT) 4 MG disintegrating tablet Take 1 tablet (4 mg total) by mouth every 8 (eight) hours as needed for nausea or vomiting. 11/10/20  Yes Valarie Merino, MD  aspirin 81 MG tablet Take 81 mg by mouth daily.    [provider]  escitalopram (LEXAPRO) 10 MG tablet Take 10 mg by mouth daily.    [provider]  folic acid (FOLVITE) 1 MG tablet Take 1 mg by mouth daily. 05/02/19   [provider]  olmesartan (BENICAR) 40 MG tablet Take 40 mg by mouth daily. 05/07/18   [provider]    Allergies    Penicillins  Review of Systems   Review of Systems  Genitourinary:  Positive for flank pain.  All other systems reviewed and are  negative.  Physical Exam Updated Vital Signs BP (!) 142/88   Pulse 76   Temp 98.4 F (36.9 C) (Oral)   Resp 17   Ht 5\' 8"  (1.727 m)   Wt 102.1 kg   SpO2 97%   BMI 34.21 kg/m   Physical Exam Vitals and nursing note reviewed.  Constitutional:      General: He is not in acute distress.    Appearance: Normal appearance. He is well-developed.  HENT:     Head: Normocephalic and atraumatic.  Eyes:     Conjunctiva/sclera: Conjunctivae normal.     Pupils: Pupils are equal, round, and reactive to light.  Cardiovascular:     Rate and Rhythm: Normal rate and regular rhythm.     Heart sounds: Normal heart sounds.  Pulmonary:     Effort: Pulmonary effort is normal. No  respiratory distress.     Breath sounds: Normal breath sounds.  Abdominal:     General: There is no distension.     Palpations: Abdomen is soft.     Tenderness: There is no abdominal tenderness.  Genitourinary:    Comments: Mild CVA tenderness on right Musculoskeletal:        General: No deformity. Normal range of motion.     Cervical back: Normal range of motion and neck supple.  Skin:    General: Skin is warm and dry.  Neurological:     General: No focal deficit present.     Mental Status: He is alert and oriented to person, place, and time.    ED Results / Procedures / Treatments   Labs (all labs ordered are listed, but only abnormal results are displayed) Labs Reviewed  CBC - Abnormal; Notable for the following components:      Result Value   WBC 15.9 (*)    All other components within normal limits  BASIC METABOLIC PANEL - Abnormal; Notable for the following components:   Sodium 133 (*)    Glucose, Bld 117 (*)    Creatinine, Ser 1.44 (*)    Calcium 8.8 (*)    GFR, Estimated 52 (*)    All other components within normal limits  URINE CULTURE  URINALYSIS, ROUTINE W REFLEX MICROSCOPIC    EKG None  Radiology CT ABDOMEN PELVIS WO CONTRAST  Result Date: 11/10/2020 CLINICAL DATA:  Right flank pain right flank pain since last night. Nausea EXAM: CT ABDOMEN AND PELVIS WITHOUT CONTRAST TECHNIQUE: Multidetector CT imaging of the abdomen and pelvis was performed following the standard protocol without IV contrast. COMPARISON:  None available FINDINGS: Lower chest: Small sliding hiatal hernia containing stomach and fat. Hepatobiliary: No focal liver abnormality.No evidence of biliary obstruction or stone. Pancreas: Unremarkable. Spleen: Unremarkable. Adrenals/Urinary Tract: Negative adrenals. 3 mm stone at the right UVJ. 5 cm cyst at the lower pole left kidney. Mild right hydroureteronephrosis and asymmetric perinephric stranding. 3 mm lower pole stone on the right. Unremarkable  bladder. Stomach/Bowel:  No obstruction. No appendicitis. Vascular/Lymphatic: No acute vascular abnormality. No mass or adenopathy. Reproductive:No pathologic findings. Enlarged prostate up lifting the bladder. Other: No ascites or pneumoperitoneum. Shallow, fatty bilateral inguinal hernia. Musculoskeletal: No acute abnormalities. IMPRESSION: 1. Mild right hydroureteronephrosis from a punctate UVJ calculus. 2. 3 mm right renal calculus. 3. Fatty bilateral inguinal hernia. 4. Enlarged prostate. Electronically Signed   By: Monte Fantasia M.D.   On: 11/10/2020 10:09    Procedures Procedures   Medications Ordered in ED Medications  morphine 4 MG/ML injection 4 mg (4 mg Intravenous  Not Given 11/10/20 0846)  sodium chloride 0.9 % bolus 1,000 mL (1,000 mLs Intravenous New Bag/Given 11/10/20 0844)  ondansetron (ZOFRAN) injection 4 mg (4 mg Intravenous Given 11/10/20 0846)  ketorolac (TORADOL) 30 MG/ML injection 15 mg (15 mg Intravenous Given 11/10/20 0845)    ED Course  I have reviewed the triage vital signs and the nursing notes.  Pertinent labs & imaging results that were available during my care of the patient were reviewed by me and considered in my medical decision making (see chart for details).    MDM Rules/Calculators/A&P                          MDM  MSE complete  Jorge Burton was evaluated in Emergency Department on 11/10/2020 for the symptoms described in the history of present illness. He was evaluated in the context of the global COVID-19 pandemic, which necessitated consideration that the patient might be at risk for infection with the SARS-CoV-2 virus that causes COVID-19. Institutional protocols and algorithms that pertain to the evaluation of patients at risk for COVID-19 are in a state of rapid change based on information released by regulatory bodies including the CDC and federal and state organizations. These policies and algorithms were followed during the patient's care in  the ED.  Patient is presenting with right-sided flank discomfort.  Patient's describe symptoms are consistent with likely renal colic.  CT imaging confirms this diagnosis.  After administration of fluids and pain medicines in the ED the patient feels improved.  He now desires discharge home.  Importance of close follow-up is stressed.  Strict return precautions given and understood.  Final Clinical Impression(s) / ED Diagnoses Final diagnoses:  Flank pain  Renal colic    Rx / DC Orders ED Discharge Orders          Ordered    HYDROcodone-acetaminophen (NORCO/VICODIN) 5-325 MG tablet  Every 4 hours PRN        11/10/20 1100    ondansetron (ZOFRAN ODT) 4 MG disintegrating tablet  Every 8 hours PRN        11/10/20 1100             Valarie Merino, MD 11/10/20 1103

## 2020-11-10 NOTE — ED Notes (Signed)
Redraw of BMP sent to lab.

## 2020-11-11 LAB — URINE CULTURE
Culture: NO GROWTH
Special Requests: NORMAL

## 2021-02-02 DIAGNOSIS — Z23 Encounter for immunization: Secondary | ICD-10-CM | POA: Diagnosis not present

## 2021-02-22 DIAGNOSIS — Z125 Encounter for screening for malignant neoplasm of prostate: Secondary | ICD-10-CM | POA: Diagnosis not present

## 2021-02-22 DIAGNOSIS — I10 Essential (primary) hypertension: Secondary | ICD-10-CM | POA: Diagnosis not present

## 2021-02-22 DIAGNOSIS — R7301 Impaired fasting glucose: Secondary | ICD-10-CM | POA: Diagnosis not present

## 2021-02-22 DIAGNOSIS — M859 Disorder of bone density and structure, unspecified: Secondary | ICD-10-CM | POA: Diagnosis not present

## 2021-03-01 DIAGNOSIS — R7301 Impaired fasting glucose: Secondary | ICD-10-CM | POA: Diagnosis not present

## 2021-03-01 DIAGNOSIS — M8580 Other specified disorders of bone density and structure, unspecified site: Secondary | ICD-10-CM | POA: Diagnosis not present

## 2021-03-01 DIAGNOSIS — I10 Essential (primary) hypertension: Secondary | ICD-10-CM | POA: Diagnosis not present

## 2021-03-01 DIAGNOSIS — Z1331 Encounter for screening for depression: Secondary | ICD-10-CM | POA: Diagnosis not present

## 2021-03-01 DIAGNOSIS — E669 Obesity, unspecified: Secondary | ICD-10-CM | POA: Diagnosis not present

## 2021-03-01 DIAGNOSIS — R82998 Other abnormal findings in urine: Secondary | ICD-10-CM | POA: Diagnosis not present

## 2021-03-01 DIAGNOSIS — Z0000 Encounter for general adult medical examination without abnormal findings: Secondary | ICD-10-CM | POA: Diagnosis not present

## 2021-03-01 DIAGNOSIS — N1831 Chronic kidney disease, stage 3a: Secondary | ICD-10-CM | POA: Diagnosis not present

## 2021-03-01 DIAGNOSIS — F3341 Major depressive disorder, recurrent, in partial remission: Secondary | ICD-10-CM | POA: Diagnosis not present

## 2021-03-01 DIAGNOSIS — Z1389 Encounter for screening for other disorder: Secondary | ICD-10-CM | POA: Diagnosis not present

## 2021-03-02 ENCOUNTER — Other Ambulatory Visit: Payer: Self-pay | Admitting: Internal Medicine

## 2021-03-02 DIAGNOSIS — I10 Essential (primary) hypertension: Secondary | ICD-10-CM

## 2021-03-18 ENCOUNTER — Ambulatory Visit
Admission: RE | Admit: 2021-03-18 | Discharge: 2021-03-18 | Disposition: A | Discharge status: Home / Self Care | Payer: Medicare PPO | Source: Ambulatory Visit | Attending: Internal Medicine | Admitting: Internal Medicine

## 2021-03-18 DIAGNOSIS — I10 Essential (primary) hypertension: Secondary | ICD-10-CM

## 2021-03-18 DIAGNOSIS — I700 Atherosclerosis of aorta: Secondary | ICD-10-CM | POA: Diagnosis not present

## 2021-11-19 DIAGNOSIS — U071 COVID-19: Secondary | ICD-10-CM

## 2021-11-19 HISTORY — DX: COVID-19: U07.1

## 2021-11-25 IMAGING — CT CT CARDIAC CORONARY ARTERY CALCIUM SCORE
3 series · 14 of 20 positions shown, 16 images · non-contrast
Comparison: None.

CLINICAL DATA: Family history

EXAM:
CT CARDIAC CORONARY ARTERY CALCIUM SCORE
TECHNIQUE: Non-contrast imaging through the heart was performed using
prospective ECG gating. Image post processing was performed on an
independent workstation, allowing for quantitative analysis of the
heart and coronary arteries. Note that this exam targets the heart
and the chest was not imaged in its entirety.

[Series 2: calcium scoring 2.00 qr36 bestdiast 71% hrt calciu · axial · 0.44mm/px · z∈[+1673,+1757]mm · 4 of 70 slices shown]
[im 14/70  vessel]
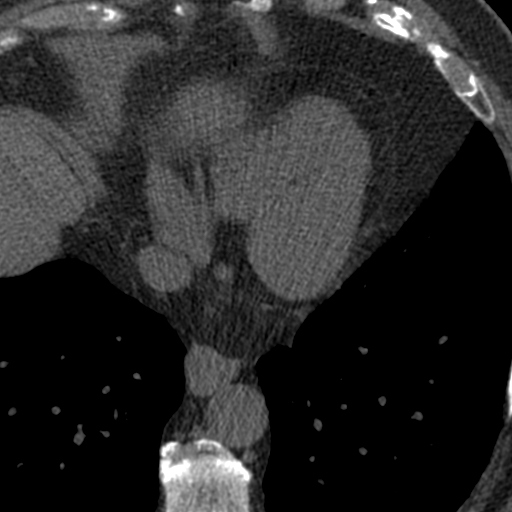
[im 28/70  vessel]
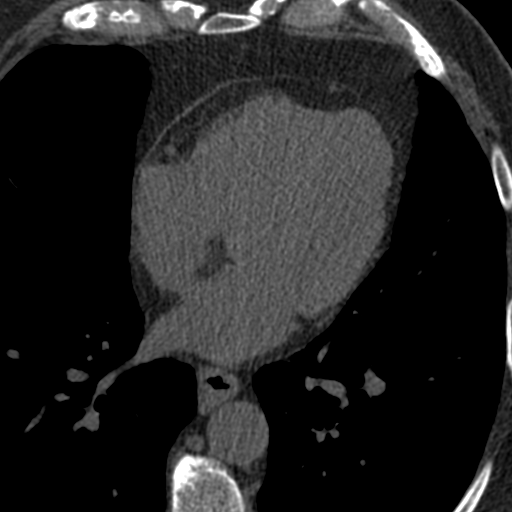
[im 42/70  vessel]
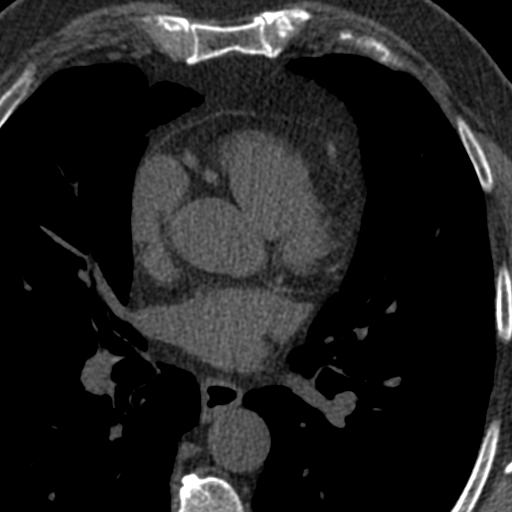
[im 56/70  vessel]
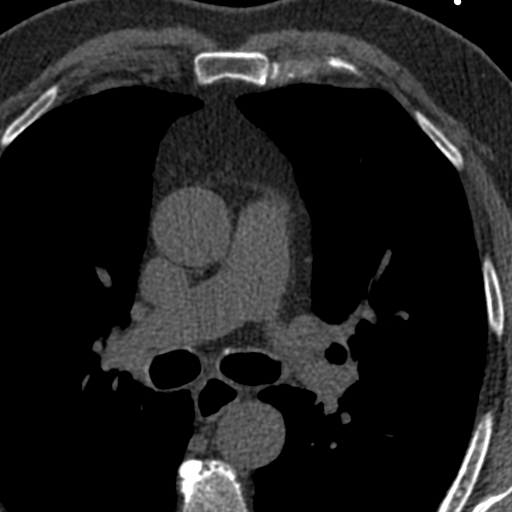

[Series 3: calcium scoring 2.00 br40 bestdiast 71% axial · axial · 0.67mm/px · z∈[+1669,+1761]mm · 5 of 70 slices shown, 7 images]
[im 12/70  vessel]
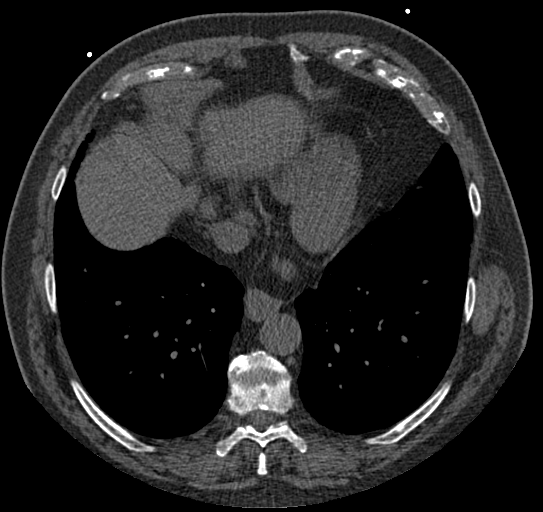
[im 12/70  lung]
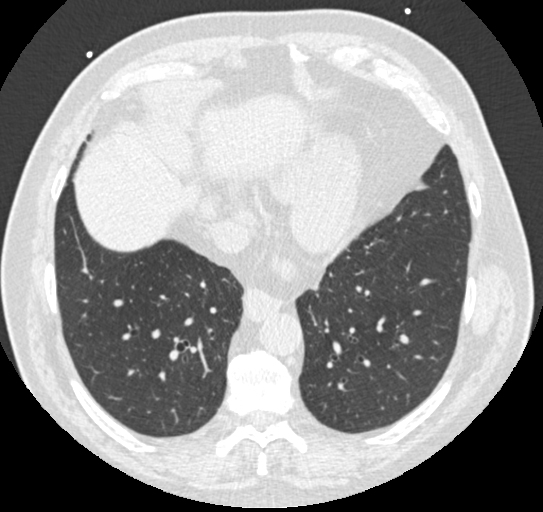
[im 24/70  vessel]
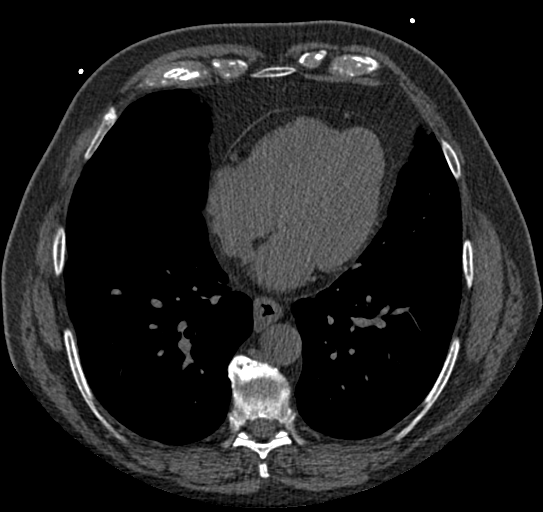
[im 35/70  vessel]
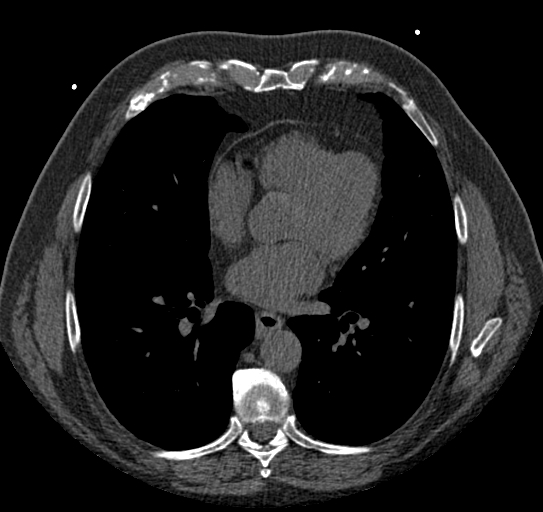
[im 47/70  vessel]
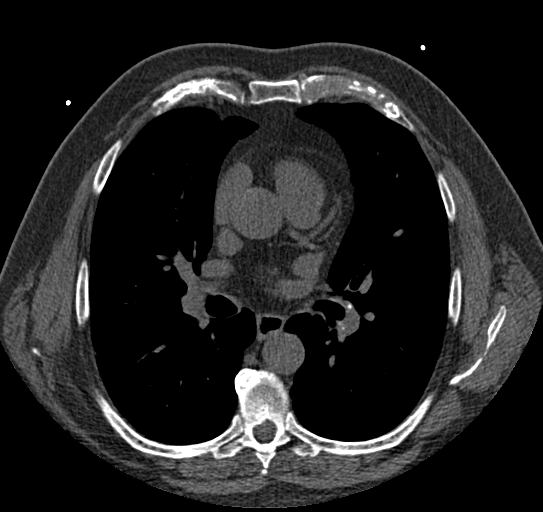
[im 58/70  vessel]
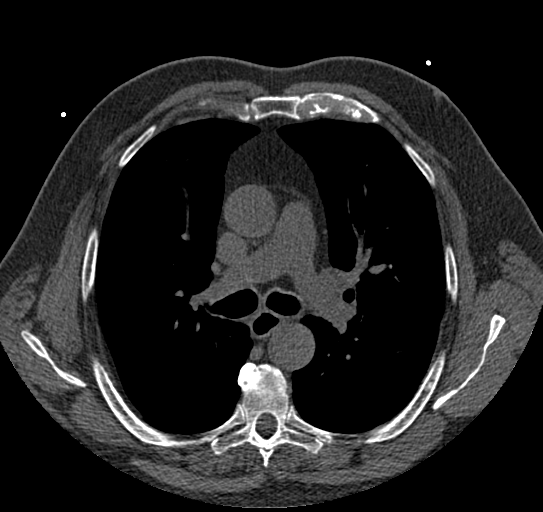
[im 58/70  lung]
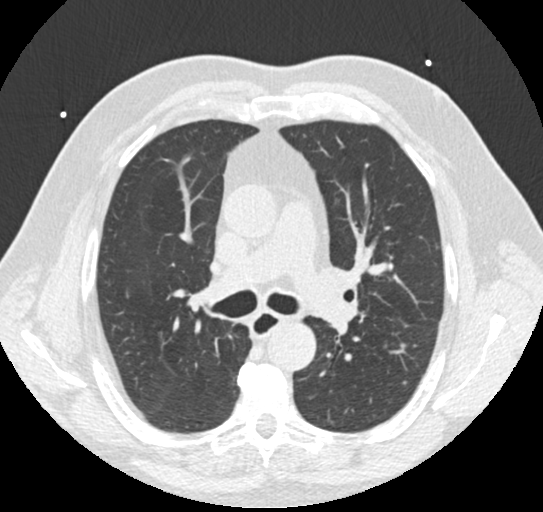

[Series 9: calcium scoring 2.00 br60 bestdiast 71% lungs · axial · 0.70mm/px · z∈[+1669,+1761]mm · 5 of 70 slices shown]
[im 12/70  vessel]
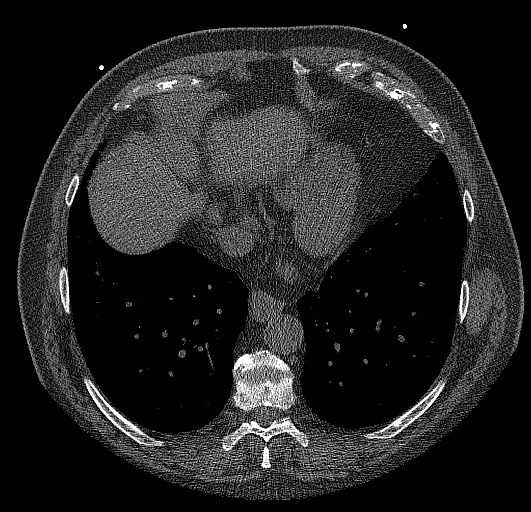
[im 24/70  vessel]
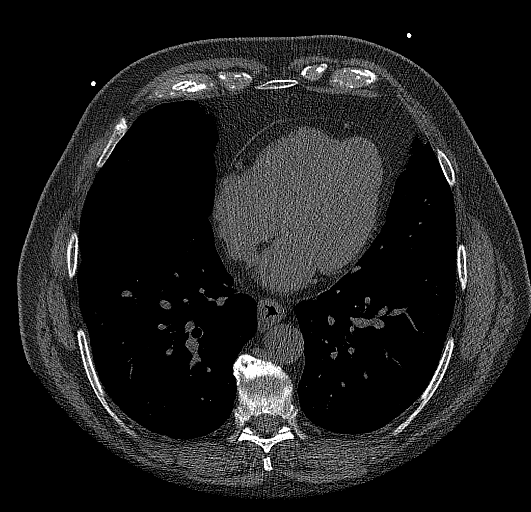
[im 35/70  vessel]
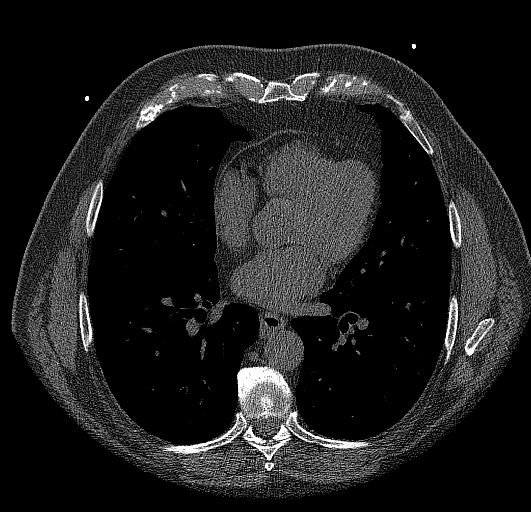
[im 47/70  vessel]
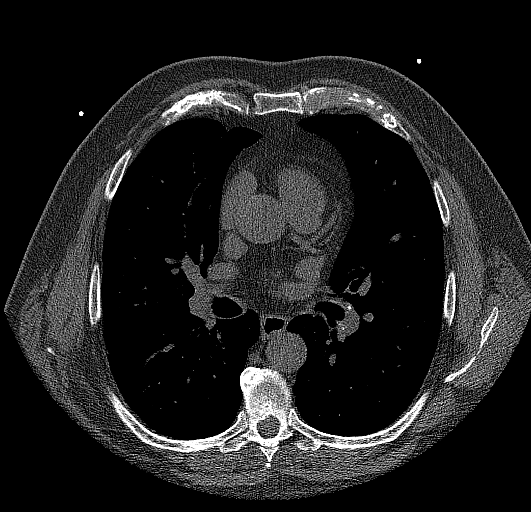
[im 58/70  vessel]
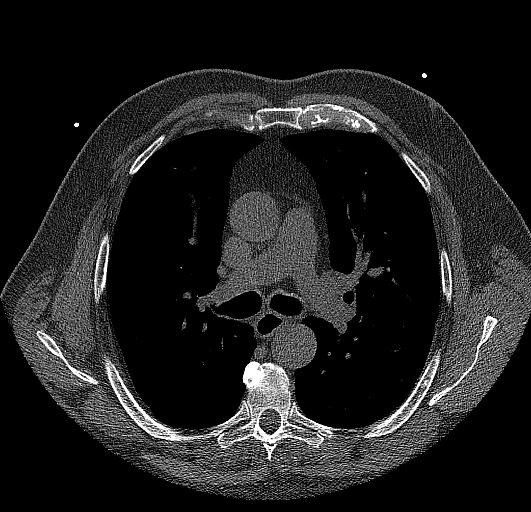

[14 of 20 positions shown; findings below may reference images not displayed]

FINDINGS: CORONARY CALCIUM SCORES:

Left Main: 0

LAD: 0

LCx: 0

RCA: 0

Total Agatston Score: 0

[HOSPITAL] percentile: 0

AORTA MEASUREMENTS:

Ascending Aorta: 33 mm

Descending Aorta: 29 mm

OTHER FINDINGS:

Heart is normal size. Aorta normal caliber. Single punctate
calcifications in the descending thoracic aorta. No adenopathy.
Small hiatal hernia. No confluent opacities or effusions. Imaging
into the upper abdomen demonstrates no acute findings. Chest wall
soft tissues are unremarkable. No acute bony abnormality.
IMPRESSION: No visible coronary artery calcifications. Total coronary calcium
score of 0.

Small hiatal hernia.

Scattered calcification in the descending thoracic aorta.

No acute extra cardiac abnormality.

## 2022-03-06 DIAGNOSIS — R7301 Impaired fasting glucose: Secondary | ICD-10-CM | POA: Diagnosis not present

## 2022-03-06 DIAGNOSIS — F411 Generalized anxiety disorder: Secondary | ICD-10-CM | POA: Diagnosis not present

## 2022-03-06 DIAGNOSIS — M8589 Other specified disorders of bone density and structure, multiple sites: Secondary | ICD-10-CM | POA: Diagnosis not present

## 2022-03-06 DIAGNOSIS — I10 Essential (primary) hypertension: Secondary | ICD-10-CM | POA: Diagnosis not present

## 2022-03-06 DIAGNOSIS — R7989 Other specified abnormal findings of blood chemistry: Secondary | ICD-10-CM | POA: Diagnosis not present

## 2022-03-06 DIAGNOSIS — Z125 Encounter for screening for malignant neoplasm of prostate: Secondary | ICD-10-CM | POA: Diagnosis not present

## 2022-03-06 DIAGNOSIS — N1831 Chronic kidney disease, stage 3a: Secondary | ICD-10-CM | POA: Diagnosis not present

## 2022-03-13 DIAGNOSIS — G4733 Obstructive sleep apnea (adult) (pediatric): Secondary | ICD-10-CM | POA: Diagnosis not present

## 2022-03-13 DIAGNOSIS — F3341 Major depressive disorder, recurrent, in partial remission: Secondary | ICD-10-CM | POA: Diagnosis not present

## 2022-03-13 DIAGNOSIS — Z23 Encounter for immunization: Secondary | ICD-10-CM | POA: Diagnosis not present

## 2022-03-13 DIAGNOSIS — N1831 Chronic kidney disease, stage 3a: Secondary | ICD-10-CM | POA: Diagnosis not present

## 2022-03-13 DIAGNOSIS — F411 Generalized anxiety disorder: Secondary | ICD-10-CM | POA: Diagnosis not present

## 2022-03-13 DIAGNOSIS — M8580 Other specified disorders of bone density and structure, unspecified site: Secondary | ICD-10-CM | POA: Diagnosis not present

## 2022-03-13 DIAGNOSIS — E669 Obesity, unspecified: Secondary | ICD-10-CM | POA: Diagnosis not present

## 2022-03-13 DIAGNOSIS — Z1331 Encounter for screening for depression: Secondary | ICD-10-CM | POA: Diagnosis not present

## 2022-03-13 DIAGNOSIS — Z1339 Encounter for screening examination for other mental health and behavioral disorders: Secondary | ICD-10-CM | POA: Diagnosis not present

## 2022-03-13 DIAGNOSIS — I129 Hypertensive chronic kidney disease with stage 1 through stage 4 chronic kidney disease, or unspecified chronic kidney disease: Secondary | ICD-10-CM | POA: Diagnosis not present

## 2022-03-13 DIAGNOSIS — Z0000 Encounter for general adult medical examination without abnormal findings: Secondary | ICD-10-CM | POA: Diagnosis not present

## 2022-03-13 DIAGNOSIS — R7301 Impaired fasting glucose: Secondary | ICD-10-CM | POA: Diagnosis not present

## 2022-03-13 DIAGNOSIS — R82998 Other abnormal findings in urine: Secondary | ICD-10-CM | POA: Diagnosis not present

## 2022-03-24 ENCOUNTER — Encounter (HOSPITAL_COMMUNITY): Payer: Self-pay | Admitting: Ophthalmology

## 2022-03-24 ENCOUNTER — Other Ambulatory Visit: Payer: Self-pay

## 2022-03-24 DIAGNOSIS — H33011 Retinal detachment with single break, right eye: Secondary | ICD-10-CM | POA: Diagnosis not present

## 2022-03-24 DIAGNOSIS — Z961 Presence of intraocular lens: Secondary | ICD-10-CM | POA: Diagnosis not present

## 2022-03-24 DIAGNOSIS — H43811 Vitreous degeneration, right eye: Secondary | ICD-10-CM | POA: Diagnosis not present

## 2022-03-24 DIAGNOSIS — H53451 Other localized visual field defect, right eye: Secondary | ICD-10-CM | POA: Diagnosis not present

## 2022-03-24 DIAGNOSIS — H33311 Horseshoe tear of retina without detachment, right eye: Secondary | ICD-10-CM | POA: Diagnosis not present

## 2022-03-24 DIAGNOSIS — H33001 Unspecified retinal detachment with retinal break, right eye: Secondary | ICD-10-CM | POA: Diagnosis not present

## 2022-03-24 DIAGNOSIS — H53131 Sudden visual loss, right eye: Secondary | ICD-10-CM | POA: Diagnosis not present

## 2022-03-24 DIAGNOSIS — H43391 Other vitreous opacities, right eye: Secondary | ICD-10-CM | POA: Diagnosis not present

## 2022-03-24 DIAGNOSIS — H35371 Puckering of macula, right eye: Secondary | ICD-10-CM | POA: Diagnosis not present

## 2022-03-24 NOTE — Progress Notes (Signed)
Spoke with pt for pre-op call. Pt denies cardiac history, HTN or Diabetes.  Shower instructions given to pt and he voiced understanding. 

## 2022-03-24 NOTE — H&P (Signed)
Date of examination:  03/24/22  Indication for surgery: Retinal detachment rigtht eye  Pertinent past medical history:  Past Medical History:  Diagnosis Date   Anxiety    Colon polyps 05/22/2001   Adenomatous polyp   COVID 11/2021   in bed for a week   Depression    Diverticulosis of colon (without mention of hemorrhage) 05/22/2006   Generalized anxiety disorder    GERD (gastroesophageal reflux disease)    Heart murmur    as a child. No one mentions it now   History of kidney stones    Obesity    OSA (obstructive sleep apnea)    CPAP    Pertinent ocular history:  none  Pertinent family history:  Family History  Problem Relation Age of Onset   Colon cancer Father 20   Heart attack Father    Heart failure Mother     General:  Healthy appearing patient in no distress.     Eyes:    Acuity OD 20/25     External: Within normal limits      Anterior segment: Within normal limits      Fundus: Supertemporal retinal detachment right eye     Impression: Retinal detachment right eye  Plan:  Retinal detachment repair right eye  Jalene Mullet, MD

## 2022-03-25 ENCOUNTER — Ambulatory Visit (HOSPITAL_BASED_OUTPATIENT_CLINIC_OR_DEPARTMENT_OTHER): Payer: Medicare PPO | Admitting: Certified Registered Nurse Anesthetist

## 2022-03-25 ENCOUNTER — Encounter (HOSPITAL_COMMUNITY): Payer: Self-pay | Admitting: Ophthalmology

## 2022-03-25 ENCOUNTER — Encounter (HOSPITAL_COMMUNITY)
Admission: RE | Disposition: A | Discharge status: Home / Self Care | Payer: Self-pay | Source: Home / Self Care | Attending: Ophthalmology

## 2022-03-25 ENCOUNTER — Ambulatory Visit (HOSPITAL_COMMUNITY)
Admission: RE | Admit: 2022-03-25 | Discharge: 2022-03-25 | Disposition: A | Discharge status: Home / Self Care | Payer: Medicare PPO | Attending: Ophthalmology | Admitting: Ophthalmology

## 2022-03-25 ENCOUNTER — Ambulatory Visit (HOSPITAL_COMMUNITY): Payer: Medicare PPO | Admitting: Certified Registered Nurse Anesthetist

## 2022-03-25 DIAGNOSIS — F32A Depression, unspecified: Secondary | ICD-10-CM | POA: Diagnosis not present

## 2022-03-25 DIAGNOSIS — H33011 Retinal detachment with single break, right eye: Secondary | ICD-10-CM | POA: Diagnosis not present

## 2022-03-25 DIAGNOSIS — K219 Gastro-esophageal reflux disease without esophagitis: Secondary | ICD-10-CM | POA: Insufficient documentation

## 2022-03-25 DIAGNOSIS — I10 Essential (primary) hypertension: Secondary | ICD-10-CM

## 2022-03-25 DIAGNOSIS — F418 Other specified anxiety disorders: Secondary | ICD-10-CM | POA: Diagnosis not present

## 2022-03-25 DIAGNOSIS — F419 Anxiety disorder, unspecified: Secondary | ICD-10-CM | POA: Diagnosis not present

## 2022-03-25 DIAGNOSIS — G4730 Sleep apnea, unspecified: Secondary | ICD-10-CM | POA: Insufficient documentation

## 2022-03-25 HISTORY — DX: Depression, unspecified: F32.A

## 2022-03-25 HISTORY — DX: Cardiac murmur, unspecified: R01.1

## 2022-03-25 HISTORY — DX: Personal history of urinary calculi: Z87.442

## 2022-03-25 HISTORY — PX: PARS PLANA VITRECTOMY: SHX2166

## 2022-03-25 HISTORY — DX: Essential (primary) hypertension: I10

## 2022-03-25 HISTORY — PX: REPAIR OF COMPLEX TRACTION RETINAL DETACHMENT: SHX6217

## 2022-03-25 HISTORY — PX: GAS/FLUID EXCHANGE: SHX5334

## 2022-03-25 LAB — BASIC METABOLIC PANEL
Anion gap: 11 (ref 5–15)
BUN: 15 mg/dL (ref 8–23)
CO2: 23 mmol/L (ref 22–32)
Calcium: 9.2 mg/dL (ref 8.9–10.3)
Chloride: 107 mmol/L (ref 98–111)
Creatinine, Ser: 1.25 mg/dL — ABNORMAL HIGH (ref 0.61–1.24)
GFR, Estimated: >60 mL/min (ref >60)
Glucose, Bld: 108 mg/dL — ABNORMAL HIGH (ref 70–99)
Potassium: 3.7 mmol/L (ref 3.5–5.1)
Sodium: 141 mmol/L (ref 135–145)

## 2022-03-25 SURGERY — PARS PLANA VITRECTOMY WITH 25 GAUGE
Anesthesia: Monitor Anesthesia Care | Site: Eye | Laterality: Right

## 2022-03-25 MED ORDER — MIDAZOLAM HCL 2 MG/2ML IJ SOLN
INTRAMUSCULAR | Status: AC
  Filled 2022-03-25: qty 2

## 2022-03-25 MED ORDER — SODIUM CHLORIDE 0.9 % IV SOLN: INTRAVENOUS | Status: DC

## 2022-03-25 MED ORDER — VANCOMYCIN SUBCONJUNCTIVAL INJECTION 25 MG/0.5 ML
INTRAOCULAR | Status: DC | PRN
  Administered 2022-03-25: 50 mg via SUBCONJUNCTIVAL

## 2022-03-25 MED ORDER — PROPARACAINE HCL 0.5 % OP SOLN: 1.0000 [drp] | OPHTHALMIC | Status: DC | PRN

## 2022-03-25 MED ORDER — PROPOFOL 10 MG/ML IV BOLUS
INTRAVENOUS | Status: AC
  Filled 2022-03-25: qty 20

## 2022-03-25 MED ORDER — ATROPINE SULFATE 1 % OP SOLN
OPHTHALMIC | Status: AC
  Filled 2022-03-25: qty 5

## 2022-03-25 MED ORDER — TROPICAMIDE 1 % OP SOLN
1.0000 [drp] | OPHTHALMIC | Status: AC | PRN
  Administered 2022-03-25 (×2): 1 [drp] via OPHTHALMIC

## 2022-03-25 MED ORDER — CHLORHEXIDINE GLUCONATE 0.12 % MT SOLN
OROMUCOSAL | Status: AC
  Administered 2022-03-25: 15 mL via OROMUCOSAL
  Filled 2022-03-25: qty 15

## 2022-03-25 MED ORDER — BSS PLUS IO SOLN
INTRAOCULAR | Status: AC
  Filled 2022-03-25: qty 500

## 2022-03-25 MED ORDER — EPINEPHRINE PF 1 MG/ML IJ SOLN
INTRAMUSCULAR | Status: AC
  Filled 2022-03-25: qty 1

## 2022-03-25 MED ORDER — PHENYLEPHRINE HCL 2.5 % OP SOLN
OPHTHALMIC | Status: AC
  Administered 2022-03-25: 1 [drp] via OPHTHALMIC
  Filled 2022-03-25: qty 2

## 2022-03-25 MED ORDER — PROPARACAINE HCL 0.5 % OP SOLN
OPHTHALMIC | Status: AC
  Administered 2022-03-25: 1 [drp] via OPHTHALMIC
  Filled 2022-03-25: qty 15

## 2022-03-25 MED ORDER — OFLOXACIN 0.3 % OP SOLN
1.0000 [drp] | OPHTHALMIC | Status: AC | PRN
  Administered 2022-03-25: 1 [drp] via OPHTHALMIC

## 2022-03-25 MED ORDER — PROPARACAINE HCL 0.5 % OP SOLN
1.0000 [drp] | OPHTHALMIC | Status: AC | PRN
  Administered 2022-03-25 (×2): 1 [drp] via OPHTHALMIC

## 2022-03-25 MED ORDER — ACETAMINOPHEN 325 MG PO TABS: 325.0000 mg | ORAL_TABLET | ORAL | Status: DC | PRN

## 2022-03-25 MED ORDER — CHLORHEXIDINE GLUCONATE 0.12 % MT SOLN: 15.0000 mL | Freq: Once | OROMUCOSAL | Status: AC

## 2022-03-25 MED ORDER — ACETAMINOPHEN 10 MG/ML IV SOLN: 1000.0000 mg | Freq: Once | INTRAVENOUS | Status: DC | PRN

## 2022-03-25 MED ORDER — BUPIVACAINE HCL (PF) 0.75 % IJ SOLN
INTRAMUSCULAR | Status: AC
  Filled 2022-03-25: qty 10

## 2022-03-25 MED ORDER — ORAL CARE MOUTH RINSE: 15.0000 mL | Freq: Once | OROMUCOSAL | Status: AC

## 2022-03-25 MED ORDER — FENTANYL CITRATE (PF) 250 MCG/5ML IJ SOLN
INTRAMUSCULAR | Status: AC
  Filled 2022-03-25: qty 5

## 2022-03-25 MED ORDER — PROMETHAZINE HCL 25 MG/ML IJ SOLN: 6.2500 mg | INTRAMUSCULAR | Status: DC | PRN

## 2022-03-25 MED ORDER — SODIUM HYALURONATE 10 MG/ML IO SOLUTION
PREFILLED_SYRINGE | INTRAOCULAR | Status: DC | PRN
  Administered 2022-03-25: .85 mL via INTRAOCULAR

## 2022-03-25 MED ORDER — AMISULPRIDE (ANTIEMETIC) 5 MG/2ML IV SOLN: 10.0000 mg | Freq: Once | INTRAVENOUS | Status: DC | PRN

## 2022-03-25 MED ORDER — CYCLOPENTOLATE HCL 1 % OP SOLN
OPHTHALMIC | Status: AC
  Administered 2022-03-25: 1 [drp] via OPHTHALMIC
  Filled 2022-03-25: qty 2

## 2022-03-25 MED ORDER — TROPICAMIDE 1 % OP SOLN
OPHTHALMIC | Status: AC
  Administered 2022-03-25: 1 [drp] via OPHTHALMIC
  Filled 2022-03-25: qty 15

## 2022-03-25 MED ORDER — PHENYLEPHRINE HCL 2.5 % OP SOLN
1.0000 [drp] | OPHTHALMIC | Status: AC | PRN
  Administered 2022-03-25 (×2): 1 [drp] via OPHTHALMIC

## 2022-03-25 MED ORDER — LIDOCAINE HCL 2 % IJ SOLN
INTRAMUSCULAR | Status: DC | PRN
  Administered 2022-03-25: 7 mL via RETROBULBAR

## 2022-03-25 MED ORDER — VANCOMYCIN SUBCONJUNCTIVAL INJECTION 25 MG/0.5 ML
25.0000 mg | INTRAOCULAR | Status: DC
  Filled 2022-03-25 (×2): qty 0.5

## 2022-03-25 MED ORDER — FENTANYL CITRATE (PF) 100 MCG/2ML IJ SOLN: 25.0000 ug | INTRAMUSCULAR | Status: DC | PRN

## 2022-03-25 MED ORDER — ACETAMINOPHEN 160 MG/5ML PO SOLN: 325.0000 mg | ORAL | Status: DC | PRN

## 2022-03-25 MED ORDER — HYALURONIDASE HUMAN 150 UNIT/ML IJ SOLN
INTRAMUSCULAR | Status: AC
  Filled 2022-03-25: qty 1

## 2022-03-25 MED ORDER — CYCLOPENTOLATE HCL 1 % OP SOLN
1.0000 [drp] | OPHTHALMIC | Status: AC | PRN
  Administered 2022-03-25 (×2): 1 [drp] via OPHTHALMIC

## 2022-03-25 MED ORDER — TOBRAMYCIN-DEXAMETHASONE 0.3-0.1 % OP OINT
TOPICAL_OINTMENT | OPHTHALMIC | Status: DC | PRN
  Administered 2022-03-25: 1 via OPHTHALMIC

## 2022-03-25 MED ORDER — OFLOXACIN 0.3 % OP SOLN
OPHTHALMIC | Status: AC
  Administered 2022-03-25: 1 [drp] via OPHTHALMIC
  Filled 2022-03-25: qty 5

## 2022-03-25 MED ORDER — BSS IO SOLN
INTRAOCULAR | Status: AC
  Filled 2022-03-25: qty 15

## 2022-03-25 MED ORDER — DEXAMETHASONE SODIUM PHOSPHATE 10 MG/ML IJ SOLN
INTRAMUSCULAR | Status: DC | PRN
  Administered 2022-03-25: 10 mg

## 2022-03-25 MED ORDER — DEXAMETHASONE SODIUM PHOSPHATE 10 MG/ML IJ SOLN
INTRAMUSCULAR | Status: AC
  Filled 2022-03-25: qty 1

## 2022-03-25 MED ORDER — EPINEPHRINE PF 1 MG/ML IJ SOLN: INTRAOCULAR | Status: DC | PRN

## 2022-03-25 MED ORDER — MIDAZOLAM HCL 2 MG/2ML IJ SOLN
INTRAMUSCULAR | Status: DC | PRN
  Administered 2022-03-25 (×2): 1 mg via INTRAVENOUS

## 2022-03-25 MED ORDER — TOBRAMYCIN-DEXAMETHASONE 0.3-0.1 % OP OINT
TOPICAL_OINTMENT | OPHTHALMIC | Status: AC
  Filled 2022-03-25: qty 3.5

## 2022-03-25 MED ORDER — LIDOCAINE HCL 2 % IJ SOLN
INTRAMUSCULAR | Status: AC
  Filled 2022-03-25: qty 20

## 2022-03-25 MED ORDER — BSS IO SOLN
INTRAOCULAR | Status: DC | PRN
  Administered 2022-03-25: 15 mL

## 2022-03-25 MED ORDER — PROPOFOL 10 MG/ML IV BOLUS
INTRAVENOUS | Status: DC | PRN
  Administered 2022-03-25: 30 mg via INTRAVENOUS

## 2022-03-25 MED ORDER — OXYCODONE HCL 5 MG/5ML PO SOLN: 5.0000 mg | Freq: Once | ORAL | Status: DC | PRN

## 2022-03-25 MED ORDER — OXYCODONE HCL 5 MG PO TABS: 5.0000 mg | ORAL_TABLET | Freq: Once | ORAL | Status: DC | PRN

## 2022-03-25 SURGICAL SUPPLY — 47 items
APL SWBSTK 6 STRL LF DISP (MISCELLANEOUS) ×2
APPLICATOR COTTON TIP 6 STRL (MISCELLANEOUS) ×3 IMPLANT
APPLICATOR COTTON TIP 6IN STRL (MISCELLANEOUS) ×2 IMPLANT
BAND WRIST GAS GREEN (MISCELLANEOUS) IMPLANT
BETADINE 5% OPHTHALMIC (OPHTHALMIC) IMPLANT
BNDG EYE OVAL (GAUZE/BANDAGES/DRESSINGS) IMPLANT
CABLE BIPOLOR RESECTION CORD (MISCELLANEOUS) IMPLANT
CANNULA VLV SOFT TIP 25G (OPHTHALMIC) ×3 IMPLANT
CANNULA VLV SOFT TIP 25GA (OPHTHALMIC) ×2 IMPLANT
CLSR STERI-STRIP ANTIMIC 1/2X4 (GAUZE/BANDAGES/DRESSINGS) ×3 IMPLANT
DRAPE HALF SHEET 40X57 (DRAPES) ×3 IMPLANT
GAS AUTO FILL CONSTEL (OPHTHALMIC) ×2
GAS AUTO FILL CONSTELLATION (OPHTHALMIC) IMPLANT
GAS WRIST BAND GREEN (MISCELLANEOUS) ×2
GLOVE SURG SYN 7.5  E (GLOVE) ×2
GLOVE SURG SYN 7.5 E (GLOVE) ×2 IMPLANT
GLOVE SURG SYN 7.5 PF PI (GLOVE) ×3 IMPLANT
KIT BASIN OR (CUSTOM PROCEDURE TRAY) ×3 IMPLANT
KIT TURNOVER KIT B (KITS) ×3 IMPLANT
LENS BIOM SUPER VIEW SET DISP (MISCELLANEOUS) ×3 IMPLANT
NDL 18GX1X1/2 (RX/OR ONLY) (NEEDLE) ×3 IMPLANT
NDL 25GX 5/8IN NON SAFETY (NEEDLE) ×3 IMPLANT
NDL FILTER BLUNT 18X1 1/2 (NEEDLE) ×3 IMPLANT
NDL HYPO 30X.5 LL (NEEDLE) ×6 IMPLANT
NDL RETROBULBAR 25GX1.5 (NEEDLE) ×3 IMPLANT
NEEDLE 18GX1X1/2 (RX/OR ONLY) (NEEDLE) ×6 IMPLANT
NEEDLE 25GX 5/8IN NON SAFETY (NEEDLE) ×2 IMPLANT
NEEDLE FILTER BLUNT 18X1 1/2 (NEEDLE) ×2 IMPLANT
NEEDLE HYPO 30X.5 LL (NEEDLE) ×4 IMPLANT
NEEDLE RETROBULBAR 25GX1.5 (NEEDLE) ×2 IMPLANT
NS IRRIG 1000ML POUR BTL (IV SOLUTION) ×3 IMPLANT
OPHTHALMIC BETADINE 5% (OPHTHALMIC) ×2
PACK VITRECTOMY CUSTOM (CUSTOM PROCEDURE TRAY) ×3 IMPLANT
PAD ARMBOARD 7.5X6 YLW CONV (MISCELLANEOUS) ×6 IMPLANT
PAK PIK VITRECTOMY CVS 25GA (OPHTHALMIC) ×3 IMPLANT
PENCIL BIPOLAR 25GA STR DISP (OPHTHALMIC RELATED) IMPLANT
PROBE LASER ILLUM FLEX CVD 25G (OPHTHALMIC) IMPLANT
SHIELD EYE LENSE ONLY DISP (GAUZE/BANDAGES/DRESSINGS) IMPLANT
SOL ANTI FOG 6CC (MISCELLANEOUS) ×3 IMPLANT
SOLUTION ANTI FOG 6CC (MISCELLANEOUS) ×2
SUT VICRYL 7 0 TG140 8 (SUTURE) ×3 IMPLANT
SUT VICRYL 8 0 TG140 8 (SUTURE) IMPLANT
SYR 10ML LL (SYRINGE) IMPLANT
SYR 20ML LL LF (SYRINGE) ×3 IMPLANT
SYR 5ML LL (SYRINGE) ×3 IMPLANT
SYR TB 1ML LUER SLIP (SYRINGE) IMPLANT
WATER STERILE IRR 1000ML POUR (IV SOLUTION) ×3 IMPLANT

## 2022-03-25 NOTE — Transfer of Care (Signed)
Immediate Anesthesia Transfer of Care Note  Patient: Jorge Burton  Procedure(s) Performed: PARS PLANA VITRECTOMY WITH 25 GAUGE (Right) GAS/FLUID EXCHANGE (Right)  Patient Location: PACU  Anesthesia Type:MAC  Level of Consciousness: awake, alert , and oriented  Airway & Oxygen Therapy: Patient Spontanous Breathing  Post-op Assessment: Report given to RN and Post -op Vital signs reviewed and stable  Post vital signs: Reviewed and stable  Last Vitals:  Vitals Value Taken Time  BP    Temp    Pulse 75 03/25/22 0913  Resp 14 03/25/22 0913  SpO2 92 % 03/25/22 0913  Vitals shown include unvalidated device data.  Last Pain:  Vitals:   03/25/22 0641  TempSrc: Oral  PainSc: 0-No pain         Complications: No notable events documented.

## 2022-03-25 NOTE — Op Note (Signed)
Aarsh Fristoe [facility] 03/25/2022    Diagnosis: Retinal detachment with single break right eye   Procedure: Retinal detachment repair right eye with [place] Vitrectomy, Endolaser, Fluid Gas Exchange, and endocautery, retinotomy, and drainage of subretinal fluid Operative Eye:  right eye  Surgeon: Royston Cowper Estimated Blood Loss: minimal Specimens for Pathology:  None Complications: none     The  patient was prepped and draped in the usual fashion for ocular surgery on the  right eye .  A lid speculum was placed.  Infusion line and trocar was placed at the 8 o'clock position approximately 3.5 mm from the surgical limbus.   The infusion line was allowed to run and then clamped when placed at the cannula opening. The line was inserted and secured to the drape with an adhesive strip.   Active trocars/cannula were placed at the 10 and 2 o'clock positions approximately 3.5 mm from the surgical limbus. The cannula was visualized in the vitreous cavity.  The light pipe and vitreous cutter were inserted into the vitreous cavity and a core vitrectomy was performed.  Care taken to remove the vitreous up to the vitreous base for 360 degrees. The retina was carefully shaved over the area of detachment and the superior retinal tear at 11 o'clock.  After careful shaving of the peripheral retina, attention was directed to drainage of subretinal fluid.   Endocautery was used to prepare a site for retinotomy.  The silicone soft-tipped canula was used to drain subretinal fluid at the endocautery site creating a retinotomy.  A complete air/fluid exchange was performed.  Subretinal fluid was drained and the retina flattened.   3 rows of endolaser were applied 360 degrees to the periphery and surrounding the break and retinotomy.   15% SF6 gas was placed in the eye.   The superior cannulas were sequentially removed with concommitant tamponade using a cotton tipped applicator and noted to be air tight.   The infusion line and trocar were removed and the sclerotomy was noted to be air tight with normal intraocular pressure by digital palpapation.  Subconjunctival injections of  Vancomycin and Dexamethasone '4mg'$ /89m were placed in the infero-medial quadrant.    The speculum and drapes were removed and the eye was patched with Polymixin/Bacitracin ophthalmic ointment. An eye shield was placed and the patient was transferred alert and conversant with stable vital signs to the post operative recovery area.  The patient tolerated the procedure well and no complications were noted.    NRoyston CowperMD

## 2022-03-25 NOTE — Anesthesia Postprocedure Evaluation (Signed)
Anesthesia Post Note  Patient: Josep Luviano  Procedure(s) Performed: PARS PLANA VITRECTOMY WITH 25 GAUGE; ENDOLASER (Right) GAS/FLUID EXCHANGE (Right: Eye) REPAIR OF RETINAL DETACHMENT; DRAINAGE OF SUBRETINAL FLUID (Right: Eye)     Patient location during evaluation: PACU Anesthesia Type: MAC Level of consciousness: awake and alert Pain management: pain level controlled Vital Signs Assessment: post-procedure vital signs reviewed and stable Respiratory status: spontaneous breathing, nonlabored ventilation, respiratory function stable and patient connected to nasal cannula oxygen Cardiovascular status: stable and blood pressure returned to baseline Postop Assessment: no apparent nausea or vomiting Anesthetic complications: no   No notable events documented.  Last Vitals:  Vitals:   03/25/22 0915 03/25/22 0945  BP: 118/85 126/85  Pulse: 77 77  Resp: 20 18  Temp: (!) 36.2 C 36.7 C  SpO2: 92% 94%    Last Pain:  Vitals:   03/25/22 0945  TempSrc:   PainSc: 0-No pain                 Effie Berkshire

## 2022-03-25 NOTE — Discharge Instructions (Signed)
DO NOT SLEEP ON BACK, THE EYE PRESSURE CAN GO UP AND CAUSE VISION LOSS   SLEEP ON SIDE WITH NOSE TO PILLOW  DURING DAY KEEP FACE DOWN.  15 MINUTES EVERY 2 HOURS IT IS OK TO LOOK STRAIGHT AHEAD (USE BATHROOM, EAT, WALK, ETC.)  

## 2022-03-25 NOTE — Anesthesia Preprocedure Evaluation (Signed)
Anesthesia Evaluation  Patient identified by MRN, date of birth, ID band Patient awake    Reviewed: Allergy & Precautions, NPO status , Patient's Chart, lab work & pertinent test results  Airway Mallampati: II  TM Distance: >3 FB Neck ROM: Full    Dental  (+) Teeth Intact, Dental Advisory Given   Pulmonary sleep apnea    breath sounds clear to auscultation       Cardiovascular hypertension, Pt. on medications  Rhythm:Regular Rate:Normal     Neuro/Psych  PSYCHIATRIC DISORDERS Anxiety Depression       GI/Hepatic Neg liver ROS,GERD  ,,  Endo/Other  negative endocrine ROS    Renal/GU negative Renal ROS     Musculoskeletal negative musculoskeletal ROS (+)    Abdominal Normal abdominal exam  (+)   Peds  Hematology negative hematology ROS (+)   Anesthesia Other Findings   Reproductive/Obstetrics                              Anesthesia Physical Anesthesia Plan  ASA: 2 and emergent  Anesthesia Plan: MAC   Post-op Pain Management:    Induction: Intravenous  PONV Risk Score and Plan: 0 and Propofol infusion  Airway Management Planned: Natural Airway and Nasal Cannula  Additional Equipment: None  Intra-op Plan:   Post-operative Plan:   Informed Consent: I have reviewed the patients History and Physical, chart, labs and discussed the procedure including the risks, benefits and alternatives for the proposed anesthesia with the patient or authorized representative who has indicated his/her understanding and acceptance.       Plan Discussed with: CRNA  Anesthesia Plan Comments:          Anesthesia Quick Evaluation

## 2022-03-25 NOTE — Brief Op Note (Signed)
03/25/2022  9:08 AM  PATIENT:  Jorge Burton  71 y.o. male  PRE-OPERATIVE DIAGNOSIS:  Right Eye Retinal Detachment  POST-OPERATIVE DIAGNOSIS:  Right Eye Retinal Detachment  PROCEDURE:  Procedure(s): RETINAL DETACHMENT REPAIR RIGHT EYE WITH PARS PLANA VITRECTOMY WITH 25 GAUGE (Right) GAS/FLUID EXCHANGE (Right) ENDOLASER (Right) ENDOCAUTERY (Right) DRAINAGE OF SUBRETINAL FLUID (Right) GAS TAMPONDE WITH SF6  (Right)  SURGEON:  Surgeon(s) and Role:    * Jalene Mullet, MD - Primary  PHYSICIAN ASSISTANT:   ASSISTANTS: none   ANESTHESIA:   local and MAC  EBL:  Minimal   BLOOD ADMINISTERED:none  DRAINS: none   LOCAL MEDICATIONS USED:  MARCAINE    and LIDOCAINE   SPECIMEN:  No Specimen  DISPOSITION OF SPECIMEN:  N/A  COUNTS:  YES  TOURNIQUET:  * No tourniquets in log *  DICTATION: .Note written in EPIC  PLAN OF CARE: Discharge to home after PACU  PATIENT DISPOSITION:  PACU - hemodynamically stable.   Delay start of Pharmacological VTE agent (>24hrs) due to surgical blood loss or risk of bleeding: not applicable

## 2022-03-25 NOTE — Anesthesia Procedure Notes (Signed)
Procedure Name: MAC Date/Time: 03/25/2022 7:38 AM  Performed by: Carolan Clines, CRNAPre-anesthesia Checklist: Patient identified, Emergency Drugs available, Suction available and Patient being monitored Patient Re-evaluated:Patient Re-evaluated prior to induction Oxygen Delivery Method: Nasal cannula Dental Injury: Teeth and Oropharynx as per pre-operative assessment

## 2022-03-26 ENCOUNTER — Encounter (HOSPITAL_COMMUNITY): Payer: Self-pay | Admitting: Ophthalmology

## 2022-04-26 DIAGNOSIS — H3581 Retinal edema: Secondary | ICD-10-CM | POA: Diagnosis not present

## 2022-04-26 DIAGNOSIS — H35371 Puckering of macula, right eye: Secondary | ICD-10-CM | POA: Diagnosis not present

## 2022-04-26 DIAGNOSIS — Z961 Presence of intraocular lens: Secondary | ICD-10-CM | POA: Diagnosis not present

## 2022-04-26 DIAGNOSIS — H35351 Cystoid macular degeneration, right eye: Secondary | ICD-10-CM | POA: Diagnosis not present

## 2022-04-26 DIAGNOSIS — H30021 Focal chorioretinal inflammation of posterior pole, right eye: Secondary | ICD-10-CM | POA: Diagnosis not present

## 2022-05-02 ENCOUNTER — Emergency Department: Payer: Medicare PPO

## 2022-05-02 ENCOUNTER — Other Ambulatory Visit: Payer: Self-pay

## 2022-05-02 ENCOUNTER — Emergency Department
Admission: EM | Admit: 2022-05-02 | Discharge: 2022-05-03 | Disposition: A | Discharge status: Home / Self Care | Payer: Medicare PPO | Attending: Emergency Medicine | Admitting: Emergency Medicine

## 2022-05-02 DIAGNOSIS — Z8616 Personal history of COVID-19: Secondary | ICD-10-CM | POA: Diagnosis not present

## 2022-05-02 DIAGNOSIS — R109 Unspecified abdominal pain: Secondary | ICD-10-CM | POA: Diagnosis present

## 2022-05-02 DIAGNOSIS — M47816 Spondylosis without myelopathy or radiculopathy, lumbar region: Secondary | ICD-10-CM | POA: Diagnosis not present

## 2022-05-02 DIAGNOSIS — N23 Unspecified renal colic: Secondary | ICD-10-CM | POA: Insufficient documentation

## 2022-05-02 DIAGNOSIS — Z79899 Other long term (current) drug therapy: Secondary | ICD-10-CM | POA: Diagnosis not present

## 2022-05-02 DIAGNOSIS — I10 Essential (primary) hypertension: Secondary | ICD-10-CM | POA: Insufficient documentation

## 2022-05-02 DIAGNOSIS — N201 Calculus of ureter: Secondary | ICD-10-CM | POA: Diagnosis not present

## 2022-05-02 LAB — CBC
HCT: 43.7 % (ref 39.0–52.0)
Hemoglobin: 14.3 g/dL (ref 13.0–17.0)
MCH: 27.4 pg (ref 26.0–34.0)
MCHC: 32.7 g/dL (ref 30.0–36.0)
MCV: 83.7 fL (ref 80.0–100.0)
Platelets: 279 10*3/uL (ref 150–400)
RBC: 5.22 MIL/uL (ref 4.22–5.81)
RDW: 14.2 % (ref 11.5–15.5)
WBC: 11.9 10*3/uL — ABNORMAL HIGH (ref 4.0–10.5)
nRBC: 0 % (ref 0.0–0.2)

## 2022-05-02 LAB — COMPREHENSIVE METABOLIC PANEL
ALT: 14 U/L (ref 0–44)
AST: 18 U/L (ref 15–41)
Albumin: 4.2 g/dL (ref 3.5–5.0)
Alkaline Phosphatase: 48 U/L (ref 38–126)
Anion gap: 8 (ref 5–15)
BUN: 23 mg/dL (ref 8–23)
CO2: 24 mmol/L (ref 22–32)
Calcium: 9.1 mg/dL (ref 8.9–10.3)
Chloride: 107 mmol/L (ref 98–111)
Creatinine, Ser: 1.21 mg/dL (ref 0.61–1.24)
GFR, Estimated: >60 mL/min (ref >60)
Glucose, Bld: 204 mg/dL — ABNORMAL HIGH (ref 70–99)
Potassium: 4 mmol/L (ref 3.5–5.1)
Sodium: 139 mmol/L (ref 135–145)
Total Bilirubin: 0.7 mg/dL (ref 0.3–1.2)
Total Protein: 7.4 g/dL (ref 6.5–8.1)

## 2022-05-02 NOTE — ED Triage Notes (Signed)
Pt in with co right flank pain states hx of kidney stones and states feels the same. Denies any dysuria or fever.

## 2022-05-03 LAB — URINALYSIS, ROUTINE W REFLEX MICROSCOPIC
Bacteria, UA: NONE SEEN
Bilirubin Urine: NEGATIVE
Glucose, UA: 50 mg/dL — AB
Ketones, ur: NEGATIVE mg/dL
Leukocytes,Ua: NEGATIVE
Nitrite: NEGATIVE
Protein, ur: NEGATIVE mg/dL
RBC / HPF: >50 RBC/hpf — ABNORMAL HIGH (ref 0–5)
Specific Gravity, Urine: 1.019 (ref 1.005–1.030)
Squamous Epithelial / HPF: NONE SEEN (ref 0–5)
pH: 5 (ref 5.0–8.0)

## 2022-05-03 MED ORDER — TAMSULOSIN HCL 0.4 MG PO CAPS: 0.4000 mg | ORAL_CAPSULE | Freq: Every day | ORAL | 0 refills | Status: DC

## 2022-05-03 MED ORDER — OXYCODONE-ACETAMINOPHEN 5-325 MG PO TABS: 1.0000 | ORAL_TABLET | ORAL | 0 refills | Status: DC | PRN

## 2022-05-03 MED ORDER — ONDANSETRON 4 MG PO TBDP: 4.0000 mg | ORAL_TABLET | Freq: Three times a day (TID) | ORAL | 0 refills | Status: DC | PRN

## 2022-05-03 MED ORDER — KETOROLAC TROMETHAMINE 30 MG/ML IJ SOLN
15.0000 mg | Freq: Once | INTRAMUSCULAR | Status: AC
  Administered 2022-05-03: 15 mg via INTRAVENOUS
  Filled 2022-05-03: qty 1

## 2022-05-03 MED ORDER — HYDROMORPHONE HCL 1 MG/ML IJ SOLN
0.5000 mg | Freq: Once | INTRAMUSCULAR | Status: AC
  Administered 2022-05-03: 0.5 mg via INTRAVENOUS
  Filled 2022-05-03: qty 0.5

## 2022-05-03 MED ORDER — ONDANSETRON HCL 4 MG/2ML IJ SOLN
4.0000 mg | Freq: Once | INTRAMUSCULAR | Status: AC
  Administered 2022-05-03: 4 mg via INTRAVENOUS
  Filled 2022-05-03: qty 2

## 2022-05-03 MED ORDER — TAMSULOSIN HCL 0.4 MG PO CAPS
0.4000 mg | ORAL_CAPSULE | Freq: Once | ORAL | Status: AC
  Administered 2022-05-03: 0.4 mg via ORAL
  Filled 2022-05-03: qty 1

## 2022-05-03 MED ORDER — OXYCODONE-ACETAMINOPHEN 5-325 MG PO TABS
1.0000 | ORAL_TABLET | Freq: Once | ORAL | Status: AC
  Administered 2022-05-03: 1 via ORAL
  Filled 2022-05-03: qty 1

## 2022-05-03 MED ORDER — SODIUM CHLORIDE 0.9 % IV BOLUS
1000.0000 mL | Freq: Once | INTRAVENOUS | Status: AC
  Administered 2022-05-03: 1000 mL via INTRAVENOUS

## 2022-05-03 NOTE — ED Provider Notes (Signed)
[facility] Provider Note    Event Date/Time   First MD Initiated Contact with Patient 05/03/22 0009     (approximate)   History   Flank Pain   HPI  Jorge Burton is a 70 y.o. male  who presents to the emergency department from home with a chief complaint of right flank pain. Sudden onset several hours ago associated with nausea. History of kidney stones not requiring lithotripsy or stents. Denies fever/chills, chest pain, shortness of breath, abdominal pain, vomiting, hematuria, testicular pain/swelling or diarrhea.      Past Medical History   Past Medical History:  Diagnosis Date   Anxiety    Colon polyps 05/22/2001   Adenomatous polyp   COVID 11/2021   in bed for a week   Depression    Diverticulosis of colon (without mention of hemorrhage) 05/22/2006   Generalized anxiety disorder    GERD (gastroesophageal reflux disease)    Heart murmur    as a child. No one mentions it now   History of kidney stones    Hypertension    Obesity    OSA (obstructive sleep apnea)    CPAP     Active Problem List   Patient Active Problem List   Diagnosis Date Noted   Prostate nodule 12/11/2013   Gastroesophageal reflux disease without esophagitis 12/11/2013   Stricture and stenosis of esophagus 12/11/2013   Personal history of colonic polyps 12/11/2013   OBSTRUCTIVE SLEEP APNEA 08/10/2007   LUMBAR SPRAIN AND STRAIN 08/10/2007     Past Surgical History   Past Surgical History:  Procedure Laterality Date   COLONOSCOPY     mulitple    EYE EXAMINATION UNDER ANESTHESIA Right 07/29/2018   Procedure: INTRA-VITREOUS LOCALIZATION;  Surgeon: Jalene Mullet, MD;  Location: [facility];  Service: Ophthalmology;  Laterality: Right;   EYE SURGERY     right eye - cataract    FRACTURE SURGERY Left    Ankle 2015   GAS/FLUID EXCHANGE Right 03/25/2022   Procedure: GAS/FLUID EXCHANGE;  Surgeon: Jalene Mullet, MD;  Location: [facility];  Service: Ophthalmology;   Laterality: Right;   PARS PLANA VITRECTOMY Right 03/25/2022   Procedure: PARS PLANA VITRECTOMY WITH 25 GAUGE; ENDOLASER;  Surgeon: Jalene Mullet, MD;  Location: [facility];  Service: Ophthalmology;  Laterality: Right;   REPAIR OF COMPLEX TRACTION RETINAL DETACHMENT Right 03/25/2022   Procedure: REPAIR OF RETINAL DETACHMENT; DRAINAGE OF SUBRETINAL FLUID;  Surgeon: Jalene Mullet, MD;  Location: [facility];  Service: Ophthalmology;  Laterality: Right;   WRIST SURGERY     left      Home Medications   Prior to Admission medications   Medication Sig Start Date End Date Taking? Authorizing Provider  ondansetron (ZOFRAN-ODT) 4 MG disintegrating tablet Take 1 tablet (4 mg total) by mouth every 8 (eight) hours as needed for nausea or vomiting. 05/03/22  Yes Paulette Blanch, MD  oxyCODONE-acetaminophen (PERCOCET/ROXICET) 5-325 MG tablet Take 1 tablet by mouth every 4 (four) hours as needed for severe pain. 05/03/22  Yes Paulette Blanch, MD  tamsulosin (FLOMAX) 0.4 MG CAPS capsule Take 1 capsule (0.4 mg total) by mouth daily. 05/03/22  Yes Paulette Blanch, MD  aspirin 81 MG tablet Take 81 mg by mouth daily.    [provider]  buPROPion (WELLBUTRIN XL) 150 MG 24 hr tablet Take 150 mg by mouth daily. Pt is weaning himself off of this med, he is now taking 1 tablet every other day.  [provider]  escitalopram (LEXAPRO) 10 MG tablet Take 10 mg by mouth daily.    [provider]  folic acid (FOLVITE) 1 MG tablet Take 1 mg by mouth daily. 05/02/19   [provider]  HYDROcodone-acetaminophen (NORCO/VICODIN) 5-325 MG tablet Take 2 tablets by mouth every 4 (four) hours as needed. 11/10/20   Valarie Merino, MD  olmesartan (BENICAR) 40 MG tablet Take 40 mg by mouth daily. 05/07/18   [provider]     Allergies  Penicillins   Family History   Family History  Problem Relation Age of Onset   Colon cancer Father 62   Heart attack Father    Heart failure Mother       Physical Exam  Triage Vital Signs: ED Triage Vitals  Enc Vitals Group     BP 05/02/22 2221 (!) 161/81     Pulse Rate 05/02/22 2221 65     Resp 05/02/22 2221 17     Temp 05/02/22 2221 98 F (36.7 C)     Temp Source 05/02/22 2221 Oral     SpO2 05/02/22 2221 95 %     Weight 05/02/22 2223 205 lb (93 kg)     Height 05/02/22 2223 '5\' 8"'$  (1.727 m)     Head Circumference --      Peak Flow --      Pain Score 05/02/22 2223 9     Pain Loc --      Pain Edu? --      Excl. in [place]? --     Updated Vital Signs: BP 120/65   Pulse 73   Temp 98.1 F (36.7 C) (Oral)   Resp 16   Ht '5\' 8"'$  (1.727 m)   Wt 93 kg   SpO2 95%   BMI 31.17 kg/m    General: Awake, mild distress.  CV:  RRR. Good peripheral perfusion.  Resp:  Normal effort. CTAB. Abd:  Nontender. Mild right CVAT. No distention.  Other:  No truncal vesicles.   ED Results / Procedures / Treatments  Labs (all labs ordered are listed, but only abnormal results are displayed) Labs Reviewed  CBC - Abnormal; Notable for the following components:      Result Value   WBC 11.9 (*)    All other components within normal limits  COMPREHENSIVE METABOLIC PANEL - Abnormal; Notable for the following components:   Glucose, Bld 204 (*)    All other components within normal limits  URINALYSIS, ROUTINE W REFLEX MICROSCOPIC - Abnormal; Notable for the following components:   Color, Urine YELLOW (*)    APPearance CLEAR (*)    Glucose, UA 50 (*)    Hgb urine dipstick LARGE (*)    RBC / HPF >50 (*)    All other components within normal limits     EKG  None   RADIOLOGY I have independently visualized and interpreted patient's CT scan as well as noted the radiology interpretation:  CT: 3-69m right ureteral stone  Official radiology report(s): CT Renal Stone Study  Result Date: 05/02/2022 CLINICAL DATA:  Right-sided flank pain, initial encounter EXAM: CT ABDOMEN AND PELVIS WITHOUT CONTRAST TECHNIQUE: Multidetector CT imaging of  the abdomen and pelvis was performed following the standard protocol without IV contrast. RADIATION DOSE REDUCTION: This exam was performed according to the departmental dose-optimization program which includes automated exposure control, adjustment of the mA and/or kV according to patient size and/or use of iterative reconstruction technique. COMPARISON:  11/10/2020 FINDINGS: Lower chest: No  acute abnormality. Hepatobiliary: Gallbladder is decompressed. Liver is within normal limits. Pancreas: Unremarkable. No pancreatic ductal dilatation or surrounding inflammatory changes. Spleen: Normal in size without focal abnormality. Adrenals/Urinary Tract: Adrenal glands are within normal limits. Kidneys are well visualize without renal calculi. Simple appearing right renal cyst is noted measuring up to 5.5 cm. No further follow-up is recommended. This is stable from the prior exam. Fullness of the right renal collecting system and right ureter is noted. This extends inferiorly to a 3-4 mm stone in the distal right ureter. This is best visualized images 73 of series 2 and 73 of series 5. no other calculi are noted. The bladder is decompressed. Stomach/Bowel: Colon is predominately decompressed. No obstructive or inflammatory changes are seen. The appendix is within normal limits. Small bowel and stomach are unremarkable. Vascular/Lymphatic: No significant vascular findings are present. No enlarged abdominal or pelvic lymph nodes. Reproductive: Prostate is unremarkable. Other: No abdominal wall hernia or abnormality. No abdominopelvic ascites. Musculoskeletal: Mild degenerative changes of lumbar spine. IMPRESSION: 3-4 mm distal right ureteral stone with mild obstructive change. No other focal abnormality is noted. Electronically Signed   By: Inez Catalina M.D.   On: 05/02/2022 22:50     PROCEDURES:  Critical Care performed: No  .1-3 Lead EKG Interpretation  Performed by: Paulette Blanch, MD Authorized by: Paulette Blanch,  MD     Interpretation: normal     ECG rate:  65   ECG rate assessment: normal     Rhythm: sinus rhythm     Ectopy: none     Conduction: normal   Comments:     Patient placed on cardiac monitor to evaluate for arrthymias    MEDICATIONS ORDERED IN ED: Medications  sodium chloride 0.9 % bolus 1,000 mL (0 mLs Intravenous Stopped 05/03/22 0158)  HYDROmorphone (DILAUDID) injection 0.5 mg (0.5 mg Intravenous Given 05/03/22 0048)  ondansetron (ZOFRAN) injection 4 mg (4 mg Intravenous Given 05/03/22 0048)  ketorolac (TORADOL) 30 MG/ML injection 15 mg (15 mg Intravenous Given 05/03/22 0048)  tamsulosin (FLOMAX) capsule 0.4 mg (0.4 mg Oral Given 05/03/22 0313)  oxyCODONE-acetaminophen (PERCOCET/ROXICET) 5-325 MG per tablet 1 tablet (1 tablet Oral Given 05/03/22 0313)     IMPRESSION / MDM / ASSESSMENT AND PLAN / ED COURSE  I reviewed the triage vital signs and the nursing notes.                             71 year old male presenting with right flank pain. Differential diagnosis includes, but is not limited to, acute appendicitis, renal colic, testicular torsion, urinary tract infection/pyelonephritis, prostatitis,  epididymitis, diverticulitis, small bowel obstruction or ileus, colitis, abdominal aortic aneurysm, gastroenteritis, hernia, etc. I have personally reviewed patient's records and note recent eye surgery on 03/25/2022.  Patient's presentation is most consistent with acute presentation with potential threat to life or bodily function.  The patient is on the cardiac monitor to evaluate for evidence of arrhythmia and/or significant heart rate changes.  Laboratory results unremarkable; CT demonstrates 59m ureteral stone. Awaiting UA. Will initiate IV fluid resuscitation, IV Dilaudid and Toradol for pain, IV Zofran for nausea. Will reassess.  Clinical Course as of 05/03/22 0719  Wed May 03, 2022  0300 Pain significantly improved. UA negative. Will discharge home on Flomax, as needed  Percocet/Zofran and patient will follow up with urology as needed. Strict return precautions given. Patient and spouse verbalize understanding and agree with plan of care. [  JS]    Clinical Course User Index [JS] Paulette Blanch, MD     FINAL CLINICAL IMPRESSION(S) / ED DIAGNOSES   Final diagnoses:  Right flank pain  Ureteral colic     Rx / DC Orders   ED Discharge Orders          Ordered    tamsulosin (FLOMAX) 0.4 MG CAPS capsule  Daily        05/03/22 0054    oxyCODONE-acetaminophen (PERCOCET/ROXICET) 5-325 MG tablet  Every 4 hours PRN        05/03/22 0054    ondansetron (ZOFRAN-ODT) 4 MG disintegrating tablet  Every 8 hours PRN        05/03/22 0054             Note:  This document was prepared using Dragon voice recognition software and may include unintentional dictation errors.   Paulette Blanch, MD 05/03/22 [phone]

## 2022-05-03 NOTE — Discharge Instructions (Signed)
1. Take pain & nausea medicines as needed (Percocet/Zofran #30). Make sure to take a stool softener while taking narcotic pain medicines. 2. Take Flomax 0.4mg daily x 14 days. 3. Drink plenty of bottled or filtered water daily. 4. Return to the ER for worsening symptoms, persistent vomiting, fever, difficulty breathing or other concerns.  

## 2022-05-09 ENCOUNTER — Ambulatory Visit: Payer: Medicare PPO | Admitting: Urology

## 2022-05-09 ENCOUNTER — Ambulatory Visit
Admission: RE | Admit: 2022-05-09 | Discharge: 2022-05-09 | Disposition: A | Discharge status: Home / Self Care | Payer: Medicare PPO | Source: Ambulatory Visit | Attending: Urology | Admitting: Urology

## 2022-05-09 ENCOUNTER — Encounter: Payer: Self-pay | Admitting: Urology

## 2022-05-09 ENCOUNTER — Ambulatory Visit
Admission: RE | Admit: 2022-05-09 | Discharge: 2022-05-09 | Disposition: A | Discharge status: Home / Self Care | Payer: Medicare PPO | Attending: Urology | Admitting: Urology

## 2022-05-09 ENCOUNTER — Other Ambulatory Visit: Payer: Self-pay | Admitting: Urology

## 2022-05-09 VITALS — BP 115/65 | HR 87 | Ht 68.0 in | Wt 205.0 lb

## 2022-05-09 DIAGNOSIS — N201 Calculus of ureter: Secondary | ICD-10-CM

## 2022-05-09 DIAGNOSIS — N23 Unspecified renal colic: Secondary | ICD-10-CM | POA: Diagnosis not present

## 2022-05-09 DIAGNOSIS — N200 Calculus of kidney: Secondary | ICD-10-CM

## 2022-05-09 LAB — URINALYSIS, COMPLETE
Bilirubin, UA: NEGATIVE
Glucose, UA: NEGATIVE
Ketones, UA: NEGATIVE
Leukocytes,UA: NEGATIVE
Nitrite, UA: NEGATIVE
Specific Gravity, UA: 1.01 (ref 1.005–1.030)
Urobilinogen, Ur: 0.2 mg/dL (ref 0.2–1.0)
pH, UA: 6 (ref 5.0–7.5)

## 2022-05-09 LAB — MICROSCOPIC EXAMINATION

## 2022-05-09 NOTE — Progress Notes (Signed)
I, DeAsia L Maxie,acting as a scribe for Hollice Espy, MD.,have documented all relevant documentation on the behalf of Hollice Espy, MD,as directed by  Hollice Espy, MD while in the presence of Hollice Espy, MD.   05/09/22 4:36 PM   [facility] 1950-10-17 [id number]  Referring provider: Ginger Organ., MD [address] [place],  [place] [address]  Chief Complaint  Patient presents with   New Patient (Initial Visit)   Nephrolithiasis    HPI: 71 year-old male with a personal history of kidney stones, here for right flank pain from ER visit on 05/02/22.   CT stone protocol on 05/02/22 showed 4 mm right distal stone with some mild urinary obstructive changes. His urinalysis was fairly bland at that time. His creatinine was 1.21, he had a borderline leukocytosis of 11.9.    Today's urinalysis shows 3-10 red blood cells.  He states that he has not passed the stone. He reports that he has had pain since Tuesday and has continued to have pain since. He states that he has had issues with taking Hydrocodone in the past. He is taking Zofran as needed for nausea. He has had loss of appetite.   No fevers or chills.  No dysuria or gross hematuria.  Never required surgical intervention for stones in the past.  PMH: Past Medical History:  Diagnosis Date   Anxiety    Colon polyps 05/22/2001   Adenomatous polyp   COVID 11/2021   in bed for a week   Depression    Diverticulosis of colon (without mention of hemorrhage) 05/22/2006   Generalized anxiety disorder    GERD (gastroesophageal reflux disease)    Heart murmur    as a child. No one mentions it now   History of kidney stones    Hypertension    Obesity    OSA (obstructive sleep apnea)    CPAP    Surgical History: Past Surgical History:  Procedure Laterality Date   COLONOSCOPY     mulitple    EYE EXAMINATION UNDER ANESTHESIA Right 07/29/2018   Procedure: INTRA-VITREOUS LOCALIZATION;  Surgeon: Jalene Mullet, MD;  Location: [facility];  Service: Ophthalmology;  Laterality: Right;   EYE SURGERY     right eye - cataract    FRACTURE SURGERY Left    Ankle 2015   GAS/FLUID EXCHANGE Right 03/25/2022   Procedure: GAS/FLUID EXCHANGE;  Surgeon: Jalene Mullet, MD;  Location: [facility];  Service: Ophthalmology;  Laterality: Right;   PARS PLANA VITRECTOMY Right 03/25/2022   Procedure: PARS PLANA VITRECTOMY WITH 25 GAUGE; ENDOLASER;  Surgeon: Jalene Mullet, MD;  Location: [facility];  Service: Ophthalmology;  Laterality: Right;   REPAIR OF COMPLEX TRACTION RETINAL DETACHMENT Right 03/25/2022   Procedure: REPAIR OF RETINAL DETACHMENT; DRAINAGE OF SUBRETINAL FLUID;  Surgeon: Jalene Mullet, MD;  Location: [facility];  Service: Ophthalmology;  Laterality: Right;   WRIST SURGERY     left     Home Medications:  Allergies as of 05/09/2022       Reactions   Penicillins Anaphylaxis, Swelling   Childhood Did it involve swelling of the face/tongue/throat, SOB, or low BP? Yes Did it involve sudden or severe rash/hives, skin peeling, or any reaction on the inside of your mouth or nose? No Did you need to seek medical attention at a hospital or doctor's office? YES When did it last happen?  childhood     If all above answers are "NO", may proceed with cephalosporin use.   Levofloxacin  Hives        Medication List        Accurate as of May 09, 2022  4:36 PM. If you have any questions, ask your nurse or doctor.          STOP taking these medications    aspirin 81 MG tablet Stopped by: Hollice Espy, MD   HYDROcodone-acetaminophen 5-325 MG tablet Commonly known as: NORCO/VICODIN Stopped by: Hollice Espy, MD       TAKE these medications    buPROPion 150 MG 24 hr tablet Commonly known as: WELLBUTRIN XL Take 150 mg by mouth daily. Pt is weaning himself off of this med, he is now taking 1 tablet every other day.   escitalopram 10 MG tablet Commonly known as: LEXAPRO Take 10 mg by mouth  daily.   folic acid 1 MG tablet Commonly known as: FOLVITE Take 1 mg by mouth daily.   olmesartan 40 MG tablet Commonly known as: BENICAR Take 40 mg by mouth daily.   ondansetron 4 MG disintegrating tablet Commonly known as: ZOFRAN-ODT Take 1 tablet (4 mg total) by mouth every 8 (eight) hours as needed for nausea or vomiting.   oxyCODONE-acetaminophen 5-325 MG tablet Commonly known as: PERCOCET/ROXICET Take 1 tablet by mouth every 4 (four) hours as needed for severe pain.   tamsulosin 0.4 MG Caps capsule Commonly known as: Flomax Take 1 capsule (0.4 mg total) by mouth daily.        Allergies:  Allergies  Allergen Reactions   Penicillins Anaphylaxis and Swelling    Childhood Did it involve swelling of the face/tongue/throat, SOB, or low BP? Yes Did it involve sudden or severe rash/hives, skin peeling, or any reaction on the inside of your mouth or nose? No Did you need to seek medical attention at a hospital or doctor's office? YES When did it last happen?  childhood     If all above answers are "NO", may proceed with cephalosporin use.    Levofloxacin Hives    Family History: Family History  Problem Relation Age of Onset   Colon cancer Father 81   Heart attack Father    Heart failure Mother     Social History:  reports that he has never smoked. He has never used smokeless tobacco. He reports that he does not drink alcohol and does not use drugs.   Physical Exam: BP 115/65   Pulse 87   Ht '5\' 8"'$  (1.727 m)   Wt 205 lb (93 kg)   BMI 31.17 kg/m   Constitutional:  Alert and oriented, No acute distress.  Appears uncomfortable, accompanied by wife today. HEENT: [place] AT, moist mucus membranes.  Trachea midline, no masses. Neurologic: Grossly intact, no focal deficits, moving all 4 extremities. Psychiatric: Normal mood and affect.  Pertinent Imaging:  Results for orders placed during the hospital encounter of 05/02/22  CT Renal Stone Study  Narrative CLINICAL  DATA:  Right-sided flank pain, initial encounter  EXAM: CT ABDOMEN AND PELVIS WITHOUT CONTRAST  TECHNIQUE: Multidetector CT imaging of the abdomen and pelvis was performed following the standard protocol without IV contrast.  RADIATION DOSE REDUCTION: This exam was performed according to the departmental dose-optimization program which includes automated exposure control, adjustment of the mA and/or kV according to patient size and/or use of iterative reconstruction technique.  COMPARISON:  11/10/2020  FINDINGS: Lower chest: No acute abnormality.  Hepatobiliary: Gallbladder is decompressed. Liver is within normal limits.  Pancreas: Unremarkable. No pancreatic ductal dilatation or surrounding inflammatory changes.  Spleen: Normal in size without focal abnormality.  Adrenals/Urinary Tract: Adrenal glands are within normal limits. Kidneys are well visualize without renal calculi. Simple appearing right renal cyst is noted measuring up to 5.5 cm. No further follow-up is recommended. This is stable from the prior exam. Fullness of the right renal collecting system and right ureter is noted. This extends inferiorly to a 3-4 mm stone in the distal right ureter. This is best visualized images 73 of series 2 and 73 of series 5. no other calculi are noted. The bladder is decompressed.  Stomach/Bowel: Colon is predominately decompressed. No obstructive or inflammatory changes are seen. The appendix is within normal limits. Small bowel and stomach are unremarkable.  Vascular/Lymphatic: No significant vascular findings are present. No enlarged abdominal or pelvic lymph nodes.  Reproductive: Prostate is unremarkable.  Other: No abdominal wall hernia or abnormality. No abdominopelvic ascites.  Musculoskeletal: Mild degenerative changes of lumbar spine.  IMPRESSION: 3-4 mm distal right ureteral stone with mild obstructive change.  No other focal abnormality is  noted.   Electronically Signed By: Inez Catalina M.D. On: 05/02/2022 22:50  Personally reviewed and agree with radiologic interpretation.   Assessment & Plan:    Kidney stones 4 mm obstructing right distal ureteral stone, retained with poor pain control  We discussed various treatment options for urolithiasis including observation with or without medical expulsive therapy, shockwave lithotripsy (SWL), ureteroscopy and laser lithotripsy with stent placement, and percutaneous nephrolithotomy.   We discussed that management is based on stone size, location, density, patient co-morbidities, and patient preference.    Stones <45m in size have a >80% spontaneous passage rate. Data surrounding the use of tamsulosin for medical expulsive therapy is controversial, but meta analyses suggests it is most efficacious for distal stones between 5-153min size. Possible side effects include dizziness/lightheadedness, and retrograde ejaculation.   ESWL has a lower stone free rate in a single procedure, but also a lower complication rate compared to ureteroscopy and avoids a stent and associated stent related symptoms. Possible complications include renal hematoma, steinstrasse, and need for additional treatment. We discussed the role of his increased skin to stone distance can lead to decreased efficacy with shockwave lithotripsy.   Ureteroscopy with laser lithotripsy and stent placement has a higher stone free rate than SWL in a single procedure, however increased complication rate including possible infection, ureteral injury, bleeding, and stent related morbidity. Common stent related symptoms include dysuria, urgency/frequency, and flank pain.   After an extensive discussion of the risks and benefits of the above treatment options, the patient would like to proceed with ESWL.   - KUB order sent - Send urine for pre-op - Discussed the warning symptoms and indications from our urgent emergent  intervention.    [place] [id number]

## 2022-05-09 NOTE — Progress Notes (Unsigned)
ESWL ORDER FORM  Expected date of procedure:   Surgeon: Hollice Espy, MD  Post op standing: 2-4wk follow up w/KUB prior  Anticoagulation/Aspirin/NSAID standing order: Hold all 72 hours prior  Anesthesia standing order: MAC  VTE standing: SCD's  Dx: Right Ureteral Stone  Procedure: right Extracorporeal shock wave lithotripsy  CPT : 38887  Standing Order Set:   *NPO after mn   NO KUB NEEDED  *NS 127m/hr, Keflex 502mPO, Benadryl 2533mO, Valium 59m11m, Zofran 4mg 62m   Medications if other than standing orders:   Keflex 500mg 77m

## 2022-05-09 NOTE — H&P (View-Only) (Signed)
I, DeAsia L Maxie,acting as a scribe for Hollice Espy, MD.,have documented all relevant documentation on the behalf of Hollice Espy, MD,as directed by  Hollice Espy, MD while in the presence of Hollice Espy, MD.   05/09/22 4:36 PM   [facility] July 05, 1950 [id number]  Referring provider: Ginger Organ., MD [address] [place],  [place] [address]  Chief Complaint  Patient presents with   New Patient (Initial Visit)   Nephrolithiasis    HPI: 71 year-old male with a personal history of kidney stones, here for right flank pain from ER visit on 05/02/22.   CT stone protocol on 05/02/22 showed 4 mm right distal stone with some mild urinary obstructive changes. His urinalysis was fairly bland at that time. His creatinine was 1.21, he had a borderline leukocytosis of 11.9.    Today's urinalysis shows 3-10 red blood cells.  He states that he has not passed the stone. He reports that he has had pain since Tuesday and has continued to have pain since. He states that he has had issues with taking Hydrocodone in the past. He is taking Zofran as needed for nausea. He has had loss of appetite.   No fevers or chills.  No dysuria or gross hematuria.  Never required surgical intervention for stones in the past.  PMH: Past Medical History:  Diagnosis Date   Anxiety    Colon polyps 05/22/2001   Adenomatous polyp   COVID 11/2021   in bed for a week   Depression    Diverticulosis of colon (without mention of hemorrhage) 05/22/2006   Generalized anxiety disorder    GERD (gastroesophageal reflux disease)    Heart murmur    as a child. No one mentions it now   History of kidney stones    Hypertension    Obesity    OSA (obstructive sleep apnea)    CPAP    Surgical History: Past Surgical History:  Procedure Laterality Date   COLONOSCOPY     mulitple    EYE EXAMINATION UNDER ANESTHESIA Right 07/29/2018   Procedure: INTRA-VITREOUS LOCALIZATION;  Surgeon: Jalene Mullet, MD;  Location: [place];  Service: Ophthalmology;  Laterality: Right;   EYE SURGERY     right eye - cataract    FRACTURE SURGERY Left    Ankle 2015   GAS/FLUID EXCHANGE Right 03/25/2022   Procedure: GAS/FLUID EXCHANGE;  Surgeon: Jalene Mullet, MD;  Location: [place];  Service: Ophthalmology;  Laterality: Right;   PARS PLANA VITRECTOMY Right 03/25/2022   Procedure: PARS PLANA VITRECTOMY WITH 25 GAUGE; ENDOLASER;  Surgeon: Jalene Mullet, MD;  Location: [place];  Service: Ophthalmology;  Laterality: Right;   REPAIR OF COMPLEX TRACTION RETINAL DETACHMENT Right 03/25/2022   Procedure: REPAIR OF RETINAL DETACHMENT; DRAINAGE OF SUBRETINAL FLUID;  Surgeon: Jalene Mullet, MD;  Location: [place];  Service: Ophthalmology;  Laterality: Right;   WRIST SURGERY     left     Home Medications:  Allergies as of 05/09/2022       Reactions   Penicillins Anaphylaxis, Swelling   Childhood Did it involve swelling of the face/tongue/throat, SOB, or low BP? Yes Did it involve sudden or severe rash/hives, skin peeling, or any reaction on the inside of your mouth or nose? No Did you need to seek medical attention at a hospital or doctor's office? YES When did it last happen?  childhood     If all above answers are "NO", may proceed with cephalosporin use.   Levofloxacin  Hives        Medication List        Accurate as of May 09, 2022  4:36 PM. If you have any questions, ask your nurse or doctor.          STOP taking these medications    aspirin 81 MG tablet Stopped by: Hollice Espy, MD   HYDROcodone-acetaminophen 5-325 MG tablet Commonly known as: NORCO/VICODIN Stopped by: Hollice Espy, MD       TAKE these medications    buPROPion 150 MG 24 hr tablet Commonly known as: WELLBUTRIN XL Take 150 mg by mouth daily. Pt is weaning himself off of this med, he is now taking 1 tablet every other day.   escitalopram 10 MG tablet Commonly known as: LEXAPRO Take 10 mg by mouth  daily.   folic acid 1 MG tablet Commonly known as: FOLVITE Take 1 mg by mouth daily.   olmesartan 40 MG tablet Commonly known as: BENICAR Take 40 mg by mouth daily.   ondansetron 4 MG disintegrating tablet Commonly known as: ZOFRAN-ODT Take 1 tablet (4 mg total) by mouth every 8 (eight) hours as needed for nausea or vomiting.   oxyCODONE-acetaminophen 5-325 MG tablet Commonly known as: PERCOCET/ROXICET Take 1 tablet by mouth every 4 (four) hours as needed for severe pain.   tamsulosin 0.4 MG Caps capsule Commonly known as: Flomax Take 1 capsule (0.4 mg total) by mouth daily.        Allergies:  Allergies  Allergen Reactions   Penicillins Anaphylaxis and Swelling    Childhood Did it involve swelling of the face/tongue/throat, SOB, or low BP? Yes Did it involve sudden or severe rash/hives, skin peeling, or any reaction on the inside of your mouth or nose? No Did you need to seek medical attention at a hospital or doctor's office? YES When did it last happen?  childhood     If all above answers are "NO", may proceed with cephalosporin use.    Levofloxacin Hives    Family History: Family History  Problem Relation Age of Onset   Colon cancer Father 24   Heart attack Father    Heart failure Mother     Social History:  reports that he has never smoked. He has never used smokeless tobacco. He reports that he does not drink alcohol and does not use drugs.   Physical Exam: BP 115/65   Pulse 87   Ht '5\' 8"'$  (1.727 m)   Wt 205 lb (93 kg)   BMI 31.17 kg/m   Constitutional:  Alert and oriented, No acute distress.  Appears uncomfortable, accompanied by wife today. HEENT: [place] AT, moist mucus membranes.  Trachea midline, no masses. Neurologic: Grossly intact, no focal deficits, moving all 4 extremities. Psychiatric: Normal mood and affect.  Pertinent Imaging:  Results for orders placed during the hospital encounter of 05/02/22  CT Renal Stone Study  Narrative CLINICAL  DATA:  Right-sided flank pain, initial encounter  EXAM: CT ABDOMEN AND PELVIS WITHOUT CONTRAST  TECHNIQUE: Multidetector CT imaging of the abdomen and pelvis was performed following the standard protocol without IV contrast.  RADIATION DOSE REDUCTION: This exam was performed according to the departmental dose-optimization program which includes automated exposure control, adjustment of the mA and/or kV according to patient size and/or use of iterative reconstruction technique.  COMPARISON:  11/10/2020  FINDINGS: Lower chest: No acute abnormality.  Hepatobiliary: Gallbladder is decompressed. Liver is within normal limits.  Pancreas: Unremarkable. No pancreatic ductal dilatation or surrounding inflammatory changes.  Spleen: Normal in size without focal abnormality.  Adrenals/Urinary Tract: Adrenal glands are within normal limits. Kidneys are well visualize without renal calculi. Simple appearing right renal cyst is noted measuring up to 5.5 cm. No further follow-up is recommended. This is stable from the prior exam. Fullness of the right renal collecting system and right ureter is noted. This extends inferiorly to a 3-4 mm stone in the distal right ureter. This is best visualized images 73 of series 2 and 73 of series 5. no other calculi are noted. The bladder is decompressed.  Stomach/Bowel: Colon is predominately decompressed. No obstructive or inflammatory changes are seen. The appendix is within normal limits. Small bowel and stomach are unremarkable.  Vascular/Lymphatic: No significant vascular findings are present. No enlarged abdominal or pelvic lymph nodes.  Reproductive: Prostate is unremarkable.  Other: No abdominal wall hernia or abnormality. No abdominopelvic ascites.  Musculoskeletal: Mild degenerative changes of lumbar spine.  IMPRESSION: 3-4 mm distal right ureteral stone with mild obstructive change.  No other focal abnormality is  noted.   Electronically Signed By: Inez Catalina M.D. On: 05/02/2022 22:50  Personally reviewed and agree with radiologic interpretation.   Assessment & Plan:    Kidney stones 4 mm obstructing right distal ureteral stone, retained with poor pain control  We discussed various treatment options for urolithiasis including observation with or without medical expulsive therapy, shockwave lithotripsy (SWL), ureteroscopy and laser lithotripsy with stent placement, and percutaneous nephrolithotomy.   We discussed that management is based on stone size, location, density, patient co-morbidities, and patient preference.    Stones <35m in size have a >80% spontaneous passage rate. Data surrounding the use of tamsulosin for medical expulsive therapy is controversial, but meta analyses suggests it is most efficacious for distal stones between 5-146min size. Possible side effects include dizziness/lightheadedness, and retrograde ejaculation.   ESWL has a lower stone free rate in a single procedure, but also a lower complication rate compared to ureteroscopy and avoids a stent and associated stent related symptoms. Possible complications include renal hematoma, steinstrasse, and need for additional treatment. We discussed the role of his increased skin to stone distance can lead to decreased efficacy with shockwave lithotripsy.   Ureteroscopy with laser lithotripsy and stent placement has a higher stone free rate than SWL in a single procedure, however increased complication rate including possible infection, ureteral injury, bleeding, and stent related morbidity. Common stent related symptoms include dysuria, urgency/frequency, and flank pain.   After an extensive discussion of the risks and benefits of the above treatment options, the patient would like to proceed with ESWL.   - KUB order sent - Send urine for pre-op - Discussed the warning symptoms and indications from our urgent emergent  intervention.    [id number] Overlook DriveSuBordenuKansasNC [phone]

## 2022-05-11 ENCOUNTER — Encounter: Payer: Self-pay | Admitting: Urology

## 2022-05-11 ENCOUNTER — Encounter
Admission: RE | Disposition: A | Discharge status: Home / Self Care | Payer: Self-pay | Source: Home / Self Care | Attending: Urology

## 2022-05-11 ENCOUNTER — Other Ambulatory Visit: Payer: Self-pay

## 2022-05-11 ENCOUNTER — Ambulatory Visit
Admission: RE | Admit: 2022-05-11 | Discharge: 2022-05-11 | Disposition: A | Discharge status: Home / Self Care | Payer: Medicare PPO | Attending: Urology | Admitting: Urology

## 2022-05-11 DIAGNOSIS — G4733 Obstructive sleep apnea (adult) (pediatric): Secondary | ICD-10-CM | POA: Diagnosis not present

## 2022-05-11 DIAGNOSIS — N201 Calculus of ureter: Secondary | ICD-10-CM | POA: Insufficient documentation

## 2022-05-11 DIAGNOSIS — Z79899 Other long term (current) drug therapy: Secondary | ICD-10-CM | POA: Diagnosis not present

## 2022-05-11 DIAGNOSIS — I10 Essential (primary) hypertension: Secondary | ICD-10-CM | POA: Diagnosis not present

## 2022-05-11 HISTORY — PX: EXTRACORPOREAL SHOCK WAVE LITHOTRIPSY: SHX1557

## 2022-05-11 SURGERY — LITHOTRIPSY, ESWL
Anesthesia: Moderate Sedation | Laterality: Right

## 2022-05-11 MED ORDER — DIAZEPAM 5 MG PO TABS
ORAL_TABLET | ORAL | Status: AC
  Administered 2022-05-11: 10 mg via ORAL
  Filled 2022-05-11: qty 2

## 2022-05-11 MED ORDER — DIPHENHYDRAMINE HCL 25 MG PO CAPS
ORAL_CAPSULE | ORAL | Status: AC
  Administered 2022-05-11: 25 mg via ORAL
  Filled 2022-05-11: qty 1

## 2022-05-11 MED ORDER — DIAZEPAM 5 MG PO TABS: 10.0000 mg | ORAL_TABLET | ORAL | Status: AC

## 2022-05-11 MED ORDER — ONDANSETRON HCL 4 MG/2ML IJ SOLN
INTRAMUSCULAR | Status: AC
  Administered 2022-05-11: 4 mg via INTRAVENOUS
  Filled 2022-05-11: qty 2

## 2022-05-11 MED ORDER — CEPHALEXIN 500 MG PO CAPS
ORAL_CAPSULE | ORAL | Status: AC
  Administered 2022-05-11: 500 mg via ORAL
  Filled 2022-05-11: qty 1

## 2022-05-11 MED ORDER — SODIUM CHLORIDE 0.9 % IV SOLN: INTRAVENOUS | Status: DC

## 2022-05-11 MED ORDER — DIPHENHYDRAMINE HCL 25 MG PO CAPS: 25.0000 mg | ORAL_CAPSULE | ORAL | Status: AC

## 2022-05-11 MED ORDER — OXYCODONE-ACETAMINOPHEN 5-325 MG PO TABS: 1.0000 | ORAL_TABLET | ORAL | 0 refills | Status: DC | PRN

## 2022-05-11 MED ORDER — CEPHALEXIN 500 MG PO CAPS: 500.0000 mg | ORAL_CAPSULE | Freq: Once | ORAL | Status: AC

## 2022-05-11 MED ORDER — ONDANSETRON HCL 4 MG/2ML IJ SOLN: 4.0000 mg | Freq: Once | INTRAMUSCULAR | Status: AC

## 2022-05-11 NOTE — Discharge Instructions (Addendum)
See [facility] discharge instructions in chart. AMBULATORY SURGERY  DISCHARGE INSTRUCTIONS   The drugs that you were given will stay in your system until tomorrow so for the next 24 hours you should not:  Drive an automobile Make any legal decisions Drink any alcoholic beverage   You may resume regular meals tomorrow.  Today it is better to start with liquids and gradually work up to solid foods.  You may eat anything you prefer, but it is better to start with liquids, then soup and crackers, and gradually work up to solid foods.   Please notify your doctor immediately if you have any unusual bleeding, trouble breathing, redness and pain at the surgery site, drainage, fever, or pain not relieved by medication.    Additional Instructions:        Please contact your physician with any problems or Same Day Surgery at [phone], Monday through Friday 6 am to 4 pm, or [place] [facility] number at [phone].  

## 2022-05-11 NOTE — Interval H&P Note (Signed)
History and Physical Interval Note:  05/11/2022 8:39 AM  Jorge Burton  has presented today for surgery, with the diagnosis of Right Ureteral Stone.  The various methods of treatment have been discussed with the patient and family. After consideration of risks, benefits and other options for treatment, the patient has consented to  Procedure(s): EXTRACORPOREAL SHOCK WAVE LITHOTRIPSY (ESWL) (Right) as a surgical intervention.  The patient's history has been reviewed, patient examined, no change in status, stable for surgery.  I have reviewed the patient's chart and labs.  Questions were answered to the patient's satisfaction.    [facility] CTAB  Hollice Espy

## 2022-05-12 ENCOUNTER — Encounter: Payer: Self-pay | Admitting: Urology

## 2022-05-16 LAB — CULTURE, URINE COMPREHENSIVE

## 2022-05-17 ENCOUNTER — Other Ambulatory Visit: Payer: Self-pay

## 2022-05-17 DIAGNOSIS — N201 Calculus of ureter: Secondary | ICD-10-CM

## 2022-06-06 ENCOUNTER — Encounter: Payer: Self-pay | Admitting: Physician Assistant

## 2022-06-06 ENCOUNTER — Ambulatory Visit
Admission: RE | Admit: 2022-06-06 | Discharge: 2022-06-06 | Disposition: A | Discharge status: Home / Self Care | Payer: Medicare PPO | Source: Ambulatory Visit | Attending: Urology | Admitting: Urology

## 2022-06-06 ENCOUNTER — Ambulatory Visit (INDEPENDENT_AMBULATORY_CARE_PROVIDER_SITE_OTHER): Payer: Medicare PPO | Admitting: Physician Assistant

## 2022-06-06 VITALS — BP 126/75 | HR 85 | Ht 68.0 in | Wt 205.0 lb

## 2022-06-06 DIAGNOSIS — N201 Calculus of ureter: Secondary | ICD-10-CM | POA: Insufficient documentation

## 2022-06-06 DIAGNOSIS — Z87442 Personal history of urinary calculi: Secondary | ICD-10-CM

## 2022-06-06 DIAGNOSIS — N200 Calculus of kidney: Secondary | ICD-10-CM | POA: Diagnosis not present

## 2022-06-06 LAB — URINALYSIS, COMPLETE
Bilirubin, UA: NEGATIVE
Glucose, UA: NEGATIVE
Ketones, UA: NEGATIVE
Leukocytes,UA: NEGATIVE
Nitrite, UA: NEGATIVE
Protein,UA: NEGATIVE
Specific Gravity, UA: 1.01 (ref 1.005–1.030)
Urobilinogen, Ur: 0.2 mg/dL (ref 0.2–1.0)
pH, UA: 5.5 (ref 5.0–7.5)

## 2022-06-06 LAB — MICROSCOPIC EXAMINATION

## 2022-06-06 NOTE — Progress Notes (Signed)
06/06/2022 4:07 PM   Jorge Burton 1950-08-03 [id number]  CC: Chief Complaint  Patient presents with   Nephrolithiasis   HPI: Jorge Burton is a 72 y.o. male with PMH nephrolithiasis and OSA on CPAP who underwent ESWL with Dr. Erlene Quan on 05/11/2022 for management of a 4 mm distal right ureteral stone who presents today for postop follow-up.  Operative note describes fragmentation of the stone.  He is accompanied today by his wife, who contributes to HPI.  Today he reports he passed several stone pieces following his procedure, timing unclear.  He brings the fragments with him today.  His symptoms have resolved and he has no acute concerns today.  His last stone episode was approximately 22 years ago.  KUB today with interval resolution of the distal right ureteral stone.  In-office UA today positive for trace intact blood; urine microscopy pan negative.  PMH: Past Medical History:  Diagnosis Date   Anxiety    Colon polyps 05/22/2001   Adenomatous polyp   COVID 11/2021   in bed for a week   Depression    Diverticulosis of colon (without mention of hemorrhage) 05/22/2006   Generalized anxiety disorder    GERD (gastroesophageal reflux disease)    Heart murmur    as a child. No one mentions it now   History of kidney stones    Hypertension    Obesity    OSA (obstructive sleep apnea)    CPAP    Surgical History: Past Surgical History:  Procedure Laterality Date   COLONOSCOPY     mulitple    EXTRACORPOREAL SHOCK WAVE LITHOTRIPSY Right 05/11/2022   Procedure: EXTRACORPOREAL SHOCK WAVE LITHOTRIPSY (ESWL);  Surgeon: Hollice Espy, MD;  Location: [facility];  Service: Urology;  Laterality: Right;   EYE EXAMINATION UNDER ANESTHESIA Right 07/29/2018   Procedure: INTRA-VITREOUS LOCALIZATION;  Surgeon: Jalene Mullet, MD;  Location: [place];  Service: Ophthalmology;  Laterality: Right;   EYE SURGERY     right eye - cataract    FRACTURE SURGERY Left    Ankle 2015    GAS/FLUID EXCHANGE Right 03/25/2022   Procedure: GAS/FLUID EXCHANGE;  Surgeon: Jalene Mullet, MD;  Location: [place];  Service: Ophthalmology;  Laterality: Right;   PARS PLANA VITRECTOMY Right 03/25/2022   Procedure: PARS PLANA VITRECTOMY WITH 25 GAUGE; ENDOLASER;  Surgeon: Jalene Mullet, MD;  Location: [place];  Service: Ophthalmology;  Laterality: Right;   REPAIR OF COMPLEX TRACTION RETINAL DETACHMENT Right 03/25/2022   Procedure: REPAIR OF RETINAL DETACHMENT; DRAINAGE OF SUBRETINAL FLUID;  Surgeon: Jalene Mullet, MD;  Location: [place];  Service: Ophthalmology;  Laterality: Right;   WRIST SURGERY     left     Home Medications:  Allergies as of 06/06/2022       Reactions   Penicillins Anaphylaxis, Swelling   Childhood Did it involve swelling of the face/tongue/throat, SOB, or low BP? Yes Did it involve sudden or severe rash/hives, skin peeling, or any reaction on the inside of your mouth or nose? No Did you need to seek medical attention at a hospital or doctor's office? YES When did it last happen?  childhood     If all above answers are "NO", may proceed with cephalosporin use.   Levofloxacin Hives        Medication List        Accurate as of June 06, 2022  4:07 PM. If you have any questions, ask your nurse or doctor.  buPROPion 150 MG 24 hr tablet Commonly known as: WELLBUTRIN XL Take 150 mg by mouth daily. Pt is weaning himself off of this med, he is now taking 1 tablet every other day.   escitalopram 10 MG tablet Commonly known as: LEXAPRO Take 10 mg by mouth daily.   folic acid 1 MG tablet Commonly known as: FOLVITE Take 1 mg by mouth daily.   olmesartan 40 MG tablet Commonly known as: BENICAR Take 40 mg by mouth daily.   ondansetron 4 MG disintegrating tablet Commonly known as: ZOFRAN-ODT Take 1 tablet (4 mg total) by mouth every 8 (eight) hours as needed for nausea or vomiting.   oxyCODONE-acetaminophen 5-325 MG tablet Commonly known as:  PERCOCET/ROXICET Take 1 tablet by mouth every 4 (four) hours as needed for severe pain.   tamsulosin 0.4 MG Caps capsule Commonly known as: Flomax Take 1 capsule (0.4 mg total) by mouth daily.        Allergies:  Allergies  Allergen Reactions   Penicillins Anaphylaxis and Swelling    Childhood Did it involve swelling of the face/tongue/throat, SOB, or low BP? Yes Did it involve sudden or severe rash/hives, skin peeling, or any reaction on the inside of your mouth or nose? No Did you need to seek medical attention at a hospital or doctor's office? YES When did it last happen?  childhood     If all above answers are "NO", may proceed with cephalosporin use.    Levofloxacin Hives    Family History: Family History  Problem Relation Age of Onset   Colon cancer Father 68   Heart attack Father    Heart failure Mother     Social History:   reports that he has never smoked. He has never used smokeless tobacco. He reports that he does not drink alcohol and does not use drugs.  Physical Exam: BP 126/75   Pulse 85   Ht '5\' 8"'$  (1.727 m)   Wt 205 lb (93 kg)   BMI 31.17 kg/m   Constitutional:  Alert and oriented, no acute distress, nontoxic appearing HEENT: [place], AT Cardiovascular: No clubbing, cyanosis, or edema Respiratory: Normal respiratory effort, no increased work of breathing Skin: No rashes, bruises or suspicious lesions Neurologic: Grossly intact, no focal deficits, moving all 4 extremities Psychiatric: Normal mood and affect  Laboratory Data: Results for orders placed or performed in visit on 06/06/22  Microscopic Examination   Urine  Result Value Ref Range   WBC, UA 0-5 0 - 5 /hpf   RBC, Urine 0-2 0 - 2 /hpf   Epithelial Cells (non renal) 0-10 0 - 10 /hpf   Bacteria, UA Few None seen/Few  Urinalysis, Complete  Result Value Ref Range   Specific Gravity, UA 1.010 1.005 - 1.030   pH, UA 5.5 5.0 - 7.5   Color, UA Yellow Yellow   Appearance Ur Clear Clear    Leukocytes,UA Negative Negative   Protein,UA Negative Negative/Trace   Glucose, UA Negative Negative   Ketones, UA Negative Negative   RBC, UA Trace (A) Negative   Bilirubin, UA Negative Negative   Urobilinogen, Ur 0.2 0.2 - 1.0 mg/dL   Nitrite, UA Negative Negative   Microscopic Examination See below:    Pertinent Imaging: KUB, 06/06/2022: CLINICAL DATA:  Right ureteral calculus, status post lithotripsy   EXAM: ABDOMEN - 1 VIEW   COMPARISON:  05/09/2022   FINDINGS: The previous right distal ureteral calculus projecting near the lateral margin of the right sacrum is no longer  visible. No additional or new radiopacity compatible with urinary tract calculus is appreciable.   Bowel gas pattern unremarkable. Suspected bone islands in the left iliac bone and left proximal femoral metaphysis appear unchanged.   Loss of disc height at L4-5 suggesting degenerative disc disease. Osteophytes noted at L4-5.   IMPRESSION: 1. The previous right distal ureteral calculus is no longer visible. 2. Degenerative disc disease at L4-5. 3. Suspected bone islands in the left iliac bone and left proximal femur.     Electronically Signed   By: Van Clines M.D.   On: 06/07/2022 12:37  I personally reviewed the images referenced above and note interval resolution of the distal right ureteral stone.  Assessment & Plan:   1. Right ureteral stone Resolved.  UA bland.  Will send fragments for analysis and reach out via MyChart with stone prevention recommendations.  He prefers to follow-up annually, which is reasonable. - Urinalysis, Complete - Calculi, with Photograph (to Clinical Lab)   Return in about 1 year (around 06/07/2023) for Annual stone visit with KUB prior, Will share stone analysis results via MyChart.  Debroah Loop, PA-C  [facility] [address], [place], [place] [address] [phone]

## 2022-06-14 LAB — CALCULI, WITH PHOTOGRAPH (CLINICAL LAB)
Calcium Oxalate Monohydrate: 100 %
Weight Calculi: 14 mg

## 2022-06-28 DIAGNOSIS — Z961 Presence of intraocular lens: Secondary | ICD-10-CM | POA: Diagnosis not present

## 2022-06-28 DIAGNOSIS — H353121 Nonexudative age-related macular degeneration, left eye, early dry stage: Secondary | ICD-10-CM | POA: Diagnosis not present

## 2022-06-28 DIAGNOSIS — H35363 Drusen (degenerative) of macula, bilateral: Secondary | ICD-10-CM | POA: Diagnosis not present

## 2022-06-28 DIAGNOSIS — H35453 Secondary pigmentary degeneration, bilateral: Secondary | ICD-10-CM | POA: Diagnosis not present

## 2022-06-28 DIAGNOSIS — H353112 Nonexudative age-related macular degeneration, right eye, intermediate dry stage: Secondary | ICD-10-CM | POA: Diagnosis not present

## 2022-06-28 DIAGNOSIS — H35352 Cystoid macular degeneration, left eye: Secondary | ICD-10-CM | POA: Diagnosis not present

## 2022-07-05 ENCOUNTER — Other Ambulatory Visit: Payer: Self-pay

## 2022-07-05 DIAGNOSIS — D2361 Other benign neoplasm of skin of right upper limb, including shoulder: Secondary | ICD-10-CM | POA: Diagnosis not present

## 2022-07-05 DIAGNOSIS — D225 Melanocytic nevi of trunk: Secondary | ICD-10-CM | POA: Diagnosis not present

## 2022-07-05 DIAGNOSIS — B078 Other viral warts: Secondary | ICD-10-CM | POA: Diagnosis not present

## 2022-07-05 DIAGNOSIS — D3617 Benign neoplasm of peripheral nerves and autonomic nervous system of trunk, unspecified: Secondary | ICD-10-CM | POA: Diagnosis not present

## 2022-07-05 DIAGNOSIS — L821 Other seborrheic keratosis: Secondary | ICD-10-CM | POA: Diagnosis not present

## 2022-07-05 DIAGNOSIS — L814 Other melanin hyperpigmentation: Secondary | ICD-10-CM | POA: Diagnosis not present

## 2022-07-05 DIAGNOSIS — D2272 Melanocytic nevi of left lower limb, including hip: Secondary | ICD-10-CM | POA: Diagnosis not present

## 2022-07-05 DIAGNOSIS — D2261 Melanocytic nevi of right upper limb, including shoulder: Secondary | ICD-10-CM | POA: Diagnosis not present

## 2022-07-05 NOTE — Progress Notes (Signed)
Patient seen by Angus Seller and Pilar Jarvis, PharmD Candidate on 07/05/22 while they were picking up prescriptions for wife at [place] at [facility].   Blood pressure today was : 145/87, HR 63.   Patient has an automated home blood pressure machine. They report home readings ~130/80 and checks BP once a week.   Medication review was performed. They are taking medications as prescribed.   The following barriers to adherence were noted:  - They do not have cost concerns.  - They do not have transportation concerns.  - They do not need assistance obtaining refills.  - They do occasionally forget to take some of their prescribed medications; ~once a month.  - They do not feel like one/some of their medications make them feel poorly.  - They do not have questions or concerns about their medications.  - Per patient, they do have follow up scheduled with their primary care provider/cardiologist in October 2024 (Not shown in chart).   The following interventions were completed:  - Medications were reviewed  - Patient was educated on goal blood pressures and long term health implications of elevated blood pressure  - Patient was educated on medications, including indication and administration/  - Does not drink coffee.   The patient has follow up scheduled: October 2024  PCP: Dr. Coolidge Breeze and Pilar Jarvis, PharmD Candidates.   Joseph Art, Pharm.D. PGY-2 Ambulatory Care Pharmacy Resident 07/05/2022 5:01 PM

## 2022-11-06 DIAGNOSIS — H35363 Drusen (degenerative) of macula, bilateral: Secondary | ICD-10-CM | POA: Diagnosis not present

## 2022-11-06 DIAGNOSIS — Z961 Presence of intraocular lens: Secondary | ICD-10-CM | POA: Diagnosis not present

## 2022-11-06 DIAGNOSIS — H353121 Nonexudative age-related macular degeneration, left eye, early dry stage: Secondary | ICD-10-CM | POA: Diagnosis not present

## 2022-11-06 DIAGNOSIS — H353112 Nonexudative age-related macular degeneration, right eye, intermediate dry stage: Secondary | ICD-10-CM | POA: Diagnosis not present

## 2022-11-06 DIAGNOSIS — H35371 Puckering of macula, right eye: Secondary | ICD-10-CM | POA: Diagnosis not present

## 2022-11-06 DIAGNOSIS — H35453 Secondary pigmentary degeneration, bilateral: Secondary | ICD-10-CM | POA: Diagnosis not present

## 2022-12-18 ENCOUNTER — Telehealth: Payer: Self-pay | Admitting: Pulmonary Disease

## 2022-12-18 NOTE — Telephone Encounter (Signed)
PT lives in [place] and would like to use Dr. Belia Heman as a sleep Dr. with the approval of both he and Dr. Isaiah Serge. Please advise Dr.'s if this is OK with you.   Once approved front desk will call PT to set up appt.

## 2022-12-19 NOTE — Telephone Encounter (Signed)
Ok with me 

## 2023-01-03 NOTE — Telephone Encounter (Signed)
PT for now will stay in [facility].

## 2023-01-08 ENCOUNTER — Ambulatory Visit: Payer: Medicare PPO | Admitting: Pulmonary Disease

## 2023-02-06 ENCOUNTER — Ambulatory Visit (INDEPENDENT_AMBULATORY_CARE_PROVIDER_SITE_OTHER): Payer: Medicare PPO | Admitting: Adult Health

## 2023-02-06 ENCOUNTER — Encounter: Payer: Self-pay | Admitting: Adult Health

## 2023-02-06 ENCOUNTER — Telehealth: Payer: Self-pay | Admitting: *Deleted

## 2023-02-06 VITALS — BP 118/70 | HR 70 | Ht 68.0 in | Wt 207.2 lb

## 2023-02-06 DIAGNOSIS — G4733 Obstructive sleep apnea (adult) (pediatric): Secondary | ICD-10-CM

## 2023-02-06 NOTE — Progress Notes (Signed)
@Patient  ID: Jorge Burton, male    DOB: 07-26-1950, 72 y.o.   MRN: [id number]  Chief Complaint  Patient presents with   Consult    Referring provider: Cleatis Polka., MD  HPI: 72 year old male never smoker followed for abnormal CT chest with bilateral pulmonary infiltrates (felt secondary to acute illness/?CAP +/- Methotrexate induced pulmonary toxicity-resolved ) last seen 2022 Seen for sleep consult February 06, 2023 to establish for sleep apnea Former patient of Dr. Shelle Iron last seen 2015   TEST/EVENTS :  Split-night sleep study September 02, 2003 showed severe sleep apnea with RDI at 54/hour and SpO2 low at 84%.  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  No mold, hot tub, Jacuzzi, no feather pillows or comforter Smoking history: Never smoked Travel history: No significant travel history Relevant family history: No significant family history of lung disease  02/06/2023 Sleep consult  Patient presents today for a sleep consult to establish for sleep apnea.  Patient says he was diagnosed with sleep apnea several years ago and has been on CPAP since then.  He says he cannot sleep without CPAP.  He says he feels rested with no significant daytime sleepiness.  Wears a CPAP every single night.  Says he benefits from CPAP with decreased daytime sleepiness.  CPAP download shows 100% compliance.  Daily average usage at 7 hours.  Patient is on auto CPAP 5 to 20 cm H2O.  Daily average pressure at 14.0 cm H2O.  AHI 9.5/hour.  (Central events 2.1/hour) positive mask leaks.  He uses a fullface mask.  Typically goes to bed about 11 PM.  Takes 50 minutes to go to sleep.  Is up several times throughout the night.  Gets up at 8 AM.  Weight is down 15 pounds current weight is at 207 pounds with a BMI of 31.  No symptoms suspicious  for cataplexy or sleep paralysis.  Does not nap.  No history of congestive heart failure or stroke.  Does not use any sleep aids.  DME is [employer]. Wants to look at different mask, leaks a lot .  CPAP machine is getting old , afraid it will stop working   Medical history is significant for hypertension, chronic allergies, anxiety depression, 2020 focal chorioretinal inflammation of right eye with elevated IL-2 receptor and positive HLA B 27. Initially on prednisone started on 10/04/2018 which is tapered off. On methotrexate from 10/18/2018. This was held in early January when he developed respiratory symptoms. He was taken off methotrexate by his ophthalmologist at [facility] in May 2021   SH: Married, works from home -[employer]. Never smoker . No etoh/drugs. Adult children  FH+ CHF       Allergies  Allergen Reactions   Penicillins Anaphylaxis and Swelling    Childhood Did it involve swelling of the face/tongue/throat, SOB, or low BP? Yes Did it involve sudden or severe rash/hives, skin peeling, or any reaction on the inside of your mouth or nose? No Did you need to seek medical attention at a hospital or doctor's office? YES When did it last happen?  childhood     If all above answers are "NO", may proceed with cephalosporin use.    Levofloxacin Hives    Immunization History  Administered Date(s) Administered   Influenza, High Dose Seasonal PF 02/20/2019, 03/13/2022   Influenza-Unspecified 02/20/2020   PFIZER(Purple  Top)SARS-COV-2 Vaccination 06/26/2019, 07/21/2019, 04/10/2020    Past Medical History:  Diagnosis Date   Anxiety    Colon polyps 05/22/2001   Adenomatous polyp   COVID 11/2021   in bed for a week   Depression    Diverticulosis of colon (without mention of hemorrhage) 05/22/2006   Generalized anxiety disorder    GERD (gastroesophageal reflux disease)    Heart murmur    as a child. No one mentions it now   History of kidney stones    Hypertension    Obesity    OSA  (obstructive sleep apnea)    CPAP    Tobacco History: Social History   Tobacco Use  Smoking Status Never  Smokeless Tobacco Never   Counseling given: Not Answered   Outpatient Medications Prior to Visit  Medication Sig Dispense Refill   Cholecalciferol (VITAMIN D3) 50 MCG (2000 UT) capsule Take 2,000 Units by mouth daily.     escitalopram (LEXAPRO) 10 MG tablet Take 10 mg by mouth daily.     olmesartan (BENICAR) 40 MG tablet Take 40 mg by mouth daily.     buPROPion (WELLBUTRIN XL) 150 MG 24 hr tablet Take 150 mg by mouth daily. Pt is weaning himself off of this med, he is now taking 1 tablet every other day.     folic acid (FOLVITE) 1 MG tablet Take 1 mg by mouth daily.     ondansetron (ZOFRAN-ODT) 4 MG disintegrating tablet Take 1 tablet (4 mg total) by mouth every 8 (eight) hours as needed for nausea or vomiting. 30 tablet 0   oxyCODONE-acetaminophen (PERCOCET/ROXICET) 5-325 MG tablet Take 1 tablet by mouth every 4 (four) hours as needed for severe pain. 10 tablet 0   tamsulosin (FLOMAX) 0.4 MG CAPS capsule Take 1 capsule (0.4 mg total) by mouth daily. 14 capsule 0   No facility-administered medications prior to visit.     Review of Systems:   Constitutional:   No  weight loss, night sweats,  Fevers, chills, fatigue, or  lassitude.  HEENT:   No headaches,  Difficulty swallowing,  Tooth/dental problems, or  Sore throat,                No sneezing, itching, ear ache, nasal congestion, post nasal drip,   CV:  No chest pain,  Orthopnea, PND, swelling in lower extremities, anasarca, dizziness, palpitations, syncope.   GI  No heartburn, indigestion, abdominal pain, nausea, vomiting, diarrhea, change in bowel habits, loss of appetite, bloody stools.   Resp: No shortness of breath with exertion or at rest.  No excess mucus, no productive cough,  No non-productive cough,  No coughing up of blood.  No change in color of mucus.  No wheezing.  No chest wall deformity  Skin: no rash or  lesions.  GU: no dysuria, change in color of urine, no urgency or frequency.  No flank pain, no hematuria   MS:  No joint pain or swelling.  No decreased range of motion.  No back pain.    Physical Exam  BP 118/70 (BP Location: Left Arm, Patient Position: Sitting, Cuff Size: Normal)   Pulse 70   Ht 5\' 8"  (1.727 m)   Wt 207 lb 3.2 oz (94 kg)   SpO2 98%   BMI 31.50 kg/m   GEN: A/Ox3; pleasant , NAD, well nourished    HEENT:  [place]/AT,  NOSE-clear, THROAT-clear, no lesions, no postnasal drip or exudate noted. Class 3 MP airway   NECK:  Supple w/  fair ROM; no JVD; normal carotid impulses w/o bruits; no thyromegaly or nodules palpated; no lymphadenopathy.    RESP  Clear  P & A; w/o, wheezes/ rales/ or rhonchi. no accessory muscle use, no dullness to percussion  CARD:  RRR, no m/r/g, no peripheral edema, pulses intact, no cyanosis or clubbing.  GI:   Soft & nt; nml bowel sounds; no organomegaly or masses detected.   Musco: Warm bil, no deformities or joint swelling noted.   Neuro: alert, no focal deficits noted.    Skin: Warm, no lesions or rashes    Lab Results:      BNP No results found for: "BNP"  ProBNP No results found for: "PROBNP"  Imaging: No results found.  Administration History     None           No data to display          No results found for: "NITRICOXIDE"      Assessment & Plan:   OBSTRUCTIVE SLEEP APNEA History of severe sleep apnea dating back to 2005 on CPAP therapy with excellent compliance.  Patient has good control with a few residual events.  Will adjust CPAP pressure as some of his events may be due to mask leakage.  Will change CPAP pressure to CPAP auto 8 to 14 cm H2O.  Patient also will be referred for a mask fitting.  Wants to change to a different kind of nasal mask.  Patient education given on sleep apnea.  Also order for new CPAP machine.  - discussed how weight can impact sleep and risk for sleep disordered  breathing - discussed options to assist with weight loss: combination of diet modification, cardiovascular and strength training exercises   - had an extensive discussion regarding the adverse health consequences related to untreated sleep disordered breathing - specifically discussed the risks for hypertension, coronary artery disease, cardiac dysrhythmias, cerebrovascular disease, and diabetes - lifestyle modification discussed   - discussed how sleep disruption can increase risk of accidents, particularly when driving - safe driving practices were discussed   Plan  Patient Instructions  Continue on CPAP At bedtime, wear all night long  Keep up good work Change CPAP pressure to 8 to 14cmH2O.  CPAP download in 1 month  Order for new CPAP machine  Order for mask fitting.  Work on healthy weight  Do not drive if sleepy  Follow up in 1 year with Dr. Wynona Neat or Bird Swetz NP and As needed         Rubye Oaks, NP 02/06/2023

## 2023-02-06 NOTE — Assessment & Plan Note (Signed)
History of severe sleep apnea dating back to 2005 on CPAP therapy with excellent compliance.  Patient has good control with a few residual events.  Will adjust CPAP pressure as some of his events may be due to mask leakage.  Will change CPAP pressure to CPAP auto 8 to 14 cm H2O.  Patient also will be referred for a mask fitting.  Wants to change to a different kind of nasal mask.  Patient education given on sleep apnea.  Also order for new CPAP machine.  - discussed how weight can impact sleep and risk for sleep disordered breathing - discussed options to assist with weight loss: combination of diet modification, cardiovascular and strength training exercises   - had an extensive discussion regarding the adverse health consequences related to untreated sleep disordered breathing - specifically discussed the risks for hypertension, coronary artery disease, cardiac dysrhythmias, cerebrovascular disease, and diabetes - lifestyle modification discussed   - discussed how sleep disruption can increase risk of accidents, particularly when driving - safe driving practices were discussed   Plan  Patient Instructions  Continue on CPAP At bedtime, wear all night long  Keep up good work Change CPAP pressure to 8 to 14cmH2O.  CPAP download in 1 month  Order for new CPAP machine  Order for mask fitting.  Work on healthy weight  Do not drive if sleepy  Follow up in 1 year with Dr. Wynona Neat or Sharanya Templin NP and As needed

## 2023-02-06 NOTE — Patient Instructions (Signed)
Continue on CPAP At bedtime, wear all night long  Keep up good work Change CPAP pressure to 8 to 14cmH2O.  CPAP download in 1 month  Order for new CPAP machine  Order for mask fitting.  Work on healthy weight  Do not drive if sleepy  Follow up in 1 year with Dr. Wynona Neat or Ethel Veronica NP and As needed

## 2023-02-06 NOTE — Telephone Encounter (Signed)
Check download in 1 month

## 2023-02-19 DIAGNOSIS — G4733 Obstructive sleep apnea (adult) (pediatric): Secondary | ICD-10-CM | POA: Diagnosis not present

## 2023-03-20 DIAGNOSIS — Z125 Encounter for screening for malignant neoplasm of prostate: Secondary | ICD-10-CM | POA: Diagnosis not present

## 2023-03-20 DIAGNOSIS — R7301 Impaired fasting glucose: Secondary | ICD-10-CM | POA: Diagnosis not present

## 2023-03-20 DIAGNOSIS — M8580 Other specified disorders of bone density and structure, unspecified site: Secondary | ICD-10-CM | POA: Diagnosis not present

## 2023-03-20 DIAGNOSIS — I10 Essential (primary) hypertension: Secondary | ICD-10-CM | POA: Diagnosis not present

## 2023-03-20 DIAGNOSIS — Z1389 Encounter for screening for other disorder: Secondary | ICD-10-CM | POA: Diagnosis not present

## 2023-03-21 DIAGNOSIS — G4733 Obstructive sleep apnea (adult) (pediatric): Secondary | ICD-10-CM | POA: Diagnosis not present

## 2023-03-26 NOTE — Telephone Encounter (Signed)
Download printed for Tammy to review.

## 2023-03-27 DIAGNOSIS — R7301 Impaired fasting glucose: Secondary | ICD-10-CM | POA: Diagnosis not present

## 2023-03-27 DIAGNOSIS — Z0000 Encounter for general adult medical examination without abnormal findings: Secondary | ICD-10-CM | POA: Diagnosis not present

## 2023-03-27 DIAGNOSIS — M8580 Other specified disorders of bone density and structure, unspecified site: Secondary | ICD-10-CM | POA: Diagnosis not present

## 2023-03-27 DIAGNOSIS — I10 Essential (primary) hypertension: Secondary | ICD-10-CM | POA: Diagnosis not present

## 2023-03-27 DIAGNOSIS — Z800 Family history of malignant neoplasm of digestive organs: Secondary | ICD-10-CM | POA: Diagnosis not present

## 2023-03-27 DIAGNOSIS — Z1331 Encounter for screening for depression: Secondary | ICD-10-CM | POA: Diagnosis not present

## 2023-03-27 DIAGNOSIS — Z1339 Encounter for screening examination for other mental health and behavioral disorders: Secondary | ICD-10-CM | POA: Diagnosis not present

## 2023-03-27 DIAGNOSIS — G4733 Obstructive sleep apnea (adult) (pediatric): Secondary | ICD-10-CM | POA: Diagnosis not present

## 2023-03-27 DIAGNOSIS — E669 Obesity, unspecified: Secondary | ICD-10-CM | POA: Diagnosis not present

## 2023-03-27 DIAGNOSIS — F3341 Major depressive disorder, recurrent, in partial remission: Secondary | ICD-10-CM | POA: Diagnosis not present

## 2023-03-27 DIAGNOSIS — N1831 Chronic kidney disease, stage 3a: Secondary | ICD-10-CM | POA: Diagnosis not present

## 2023-03-27 DIAGNOSIS — F411 Generalized anxiety disorder: Secondary | ICD-10-CM | POA: Diagnosis not present

## 2023-03-27 DIAGNOSIS — R82998 Other abnormal findings in urine: Secondary | ICD-10-CM | POA: Diagnosis not present

## 2023-03-27 DIAGNOSIS — Z23 Encounter for immunization: Secondary | ICD-10-CM | POA: Diagnosis not present

## 2023-03-27 DIAGNOSIS — I129 Hypertensive chronic kidney disease with stage 1 through stage 4 chronic kidney disease, or unspecified chronic kidney disease: Secondary | ICD-10-CM | POA: Diagnosis not present

## 2023-03-29 ENCOUNTER — Telehealth: Payer: Self-pay | Admitting: Adult Health

## 2023-03-29 DIAGNOSIS — G4733 Obstructive sleep apnea (adult) (pediatric): Secondary | ICD-10-CM

## 2023-03-29 NOTE — Telephone Encounter (Signed)
Patient is aware of results/recommendations. Order placed to adjust pressure.   Nothing further needed.

## 2023-03-29 NOTE — Telephone Encounter (Signed)
CPAP download October 5 through November 3 shows excellent compliance and daily usage.  Last visit CPAP pressure was changed 8 to 14 cm H2O.  This does show improved control.  Mask is still leaking.  Continue to wear CPAP each night.  Would like to adjust CPAP pressure to auto CPAP 8 to 13 cm H2O.  Continue with office visit follow-up and recommendations

## 2023-04-04 DIAGNOSIS — Z9189 Other specified personal risk factors, not elsewhere classified: Secondary | ICD-10-CM | POA: Diagnosis not present

## 2023-04-04 DIAGNOSIS — Z139 Encounter for screening, unspecified: Secondary | ICD-10-CM | POA: Diagnosis not present

## 2023-04-21 DIAGNOSIS — G4733 Obstructive sleep apnea (adult) (pediatric): Secondary | ICD-10-CM | POA: Diagnosis not present

## 2023-04-23 DIAGNOSIS — H35371 Puckering of macula, right eye: Secondary | ICD-10-CM | POA: Diagnosis not present

## 2023-04-23 DIAGNOSIS — H35363 Drusen (degenerative) of macula, bilateral: Secondary | ICD-10-CM | POA: Diagnosis not present

## 2023-04-23 DIAGNOSIS — H353121 Nonexudative age-related macular degeneration, left eye, early dry stage: Secondary | ICD-10-CM | POA: Diagnosis not present

## 2023-04-23 DIAGNOSIS — H353112 Nonexudative age-related macular degeneration, right eye, intermediate dry stage: Secondary | ICD-10-CM | POA: Diagnosis not present

## 2023-05-01 NOTE — Telephone Encounter (Signed)
See phone message

## 2023-05-21 DIAGNOSIS — G4733 Obstructive sleep apnea (adult) (pediatric): Secondary | ICD-10-CM | POA: Diagnosis not present

## 2023-06-07 ENCOUNTER — Ambulatory Visit: Payer: Medicare PPO | Admitting: Physician Assistant

## 2023-06-21 DIAGNOSIS — G4733 Obstructive sleep apnea (adult) (pediatric): Secondary | ICD-10-CM | POA: Diagnosis not present

## 2023-07-20 DIAGNOSIS — G4733 Obstructive sleep apnea (adult) (pediatric): Secondary | ICD-10-CM | POA: Diagnosis not present

## 2023-08-19 DIAGNOSIS — G4733 Obstructive sleep apnea (adult) (pediatric): Secondary | ICD-10-CM | POA: Diagnosis not present

## 2023-09-19 DIAGNOSIS — G4733 Obstructive sleep apnea (adult) (pediatric): Secondary | ICD-10-CM | POA: Diagnosis not present

## 2023-10-19 DIAGNOSIS — G4733 Obstructive sleep apnea (adult) (pediatric): Secondary | ICD-10-CM | POA: Diagnosis not present

## 2023-10-22 DIAGNOSIS — H532 Diplopia: Secondary | ICD-10-CM | POA: Diagnosis not present

## 2023-10-22 DIAGNOSIS — H353112 Nonexudative age-related macular degeneration, right eye, intermediate dry stage: Secondary | ICD-10-CM | POA: Diagnosis not present

## 2023-10-22 DIAGNOSIS — H353121 Nonexudative age-related macular degeneration, left eye, early dry stage: Secondary | ICD-10-CM | POA: Diagnosis not present

## 2023-10-22 DIAGNOSIS — Z8669 Personal history of other diseases of the nervous system and sense organs: Secondary | ICD-10-CM | POA: Diagnosis not present

## 2023-10-22 DIAGNOSIS — H35373 Puckering of macula, bilateral: Secondary | ICD-10-CM | POA: Diagnosis not present

## 2023-10-22 DIAGNOSIS — H59811 Chorioretinal scars after surgery for detachment, right eye: Secondary | ICD-10-CM | POA: Diagnosis not present

## 2023-10-22 DIAGNOSIS — H35363 Drusen (degenerative) of macula, bilateral: Secondary | ICD-10-CM | POA: Diagnosis not present

## 2023-10-22 DIAGNOSIS — H35721 Serous detachment of retinal pigment epithelium, right eye: Secondary | ICD-10-CM | POA: Diagnosis not present

## 2023-10-30 DIAGNOSIS — L308 Other specified dermatitis: Secondary | ICD-10-CM | POA: Diagnosis not present

## 2023-11-19 DIAGNOSIS — G4733 Obstructive sleep apnea (adult) (pediatric): Secondary | ICD-10-CM | POA: Diagnosis not present

## 2023-11-29 DIAGNOSIS — M25562 Pain in left knee: Secondary | ICD-10-CM | POA: Diagnosis not present

## 2023-12-19 DIAGNOSIS — G4733 Obstructive sleep apnea (adult) (pediatric): Secondary | ICD-10-CM | POA: Diagnosis not present

## 2024-01-11 ENCOUNTER — Ambulatory Visit: Admitting: Gastroenterology

## 2024-01-11 ENCOUNTER — Encounter: Payer: Self-pay | Admitting: Gastroenterology

## 2024-01-11 VITALS — BP 106/66 | HR 65 | Ht 69.0 in | Wt 215.0 lb

## 2024-01-11 DIAGNOSIS — Z800 Family history of malignant neoplasm of digestive organs: Secondary | ICD-10-CM

## 2024-01-11 DIAGNOSIS — Z860100 Personal history of colon polyps, unspecified: Secondary | ICD-10-CM | POA: Diagnosis not present

## 2024-01-11 DIAGNOSIS — K222 Esophageal obstruction: Secondary | ICD-10-CM

## 2024-01-11 DIAGNOSIS — K219 Gastro-esophageal reflux disease without esophagitis: Secondary | ICD-10-CM

## 2024-01-11 DIAGNOSIS — R1319 Other dysphagia: Secondary | ICD-10-CM | POA: Diagnosis not present

## 2024-01-11 MED ORDER — NA SULFATE-K SULFATE-MG SULF 17.5-3.13-1.6 GM/177ML PO SOLN: 1.0000 | Freq: Once | ORAL | 0 refills | Status: AC

## 2024-01-11 NOTE — Progress Notes (Signed)
 Jorge Burton [id number] [birth year]   Chief Complaint: Difficulty swallowing  Referring Provider: Loreli Elsie JONETTA Mickey., MD Primary GI MD: Dr. Avram  HPI: Jorge Burton is a 73 y.o. male with past medical history of anxiety/depression, adenomatous colon polyps, diverticulosis, GERD, HTN, CKD, OSA, obesity who presents today to discuss EGD with dilation and repeat colonoscopy.  Labs 03/21/2023: Normal CBC, hemoglobin A1c 6.2, normal TSH, normal vitamin D, normal lipid panel, normal CMP   Patient states he has been having trouble swallowing for the last year.  Feels that food gets stuck in his throat/chest and he has to regurgitate.  Denies any heartburn, nausea, vomiting.  He has rare acid reflux for which he takes Pepcid as needed.  Denies any abdominal pain.  He does have history of previous esophageal dilation in 2015 and feels that he may need repeat dilation.  Reports that he is having regular bowel movements and denies any diarrhea, constipation, blood in his stool, melena.  He is also due for colonoscopy based on personal history of colon polyps as well as family history of colon cancer in his father.  Previous GI Procedures/Imaging   Colonoscopy 10/10/2018 - Three diminutive polyps in the rectum, removed with a cold snare. Complete resection. Partial retrieval.  - External hemorrhoids.  - Diverticulosis in the sigmoid colon.  - The examination was otherwise normal on direct and retroflexion views.  - Personal history of colonic polyps. And elderly father with history of colon cancer. - Recall 5 years Path: Surgical [P], colon, rectum, polyp (2) - HYPERPLASTIC POLYP (2 OF 2 FRAGMENTS) - NO HIGH GRADE DYSPLASIA OR MALIGNANCY IDENTIFIED  Colonoscopy 12/11/2013 - 6 sessile polyps measuring 2 to 5 mm in size found in the transverse colon, sigmoid colon, and rectum; polypectomy was performed with cold forceps and with a cold snare - Moderate diverticulosis noted in the  sigmoid colon - The colon mucosa was otherwise normal - Small prostate nodule, just to left of median fissure and distal half of lobe Path: 1. Surgical [P], transverse, polyp (2) - TUBULAR ADENOMA (ONE FRAGMENT). - POLYPOID FRAGMENT OF BENIGN COLONIC MUCOSA. - NO HIGH GRADE DYSPLASIA OR MALIGNANCY. 2. Surgical [P], rectum, sigmoid (3), polyp (4) - TUBULAR ADENOMA (ONE FRAGMENT). - ONE HYPERPLASTIC POLYP. - POLYPOID FRAGMENT OF BENIGN COLONIC MUCOSA WITH INTRAMUCOSAL LYMPHOID AGGREGATES. - NO HIGH GRADE DYSPLASIA OR MALIGNANCY.  EGD 12/11/2013 - Stricture was found at the GE junction, dilated to 15 mm - The remainder of the upper endoscopy exam was otherwise normal  Past Medical History:  Diagnosis Date   Anxiety    Colon polyps 05/22/2001   Adenomatous polyp   COVID 11/2021   in bed for a week   Depression    Diverticulosis of colon (without mention of hemorrhage) 05/22/2006   Generalized anxiety disorder    GERD (gastroesophageal reflux disease)    Heart murmur    as a child. No one mentions it now   History of kidney stones    Hypertension    Obesity    OSA (obstructive sleep apnea)    CPAP    Past Surgical History:  Procedure Laterality Date   COLONOSCOPY     mulitple    EXTRACORPOREAL SHOCK WAVE LITHOTRIPSY Right 05/11/2022   Procedure: EXTRACORPOREAL SHOCK WAVE LITHOTRIPSY (ESWL);  Surgeon: Penne Knee, MD;  Location: [facility] ORS;  Service: Urology;  Laterality: Right;   EYE EXAMINATION UNDER ANESTHESIA Right 07/29/2018   Procedure: INTRA-VITREOUS LOCALIZATION;  Surgeon: Tobie Baptist, MD;  Location: [facility] OR;  Service: Ophthalmology;  Laterality: Right;   EYE SURGERY     right eye - cataract    FRACTURE SURGERY Left    Ankle 2015   GAS/FLUID EXCHANGE Right 03/25/2022   Procedure: GAS/FLUID EXCHANGE;  Surgeon: Tobie Baptist, MD;  Location: [facility] OR;  Service: Ophthalmology;  Laterality: Right;   PARS PLANA VITRECTOMY Right 03/25/2022   Procedure: PARS PLANA  VITRECTOMY WITH 25 GAUGE; ENDOLASER;  Surgeon: Tobie Baptist, MD;  Location: [facility] OR;  Service: Ophthalmology;  Laterality: Right;   REPAIR OF COMPLEX TRACTION RETINAL DETACHMENT Right 03/25/2022   Procedure: REPAIR OF RETINAL DETACHMENT; DRAINAGE OF SUBRETINAL FLUID;  Surgeon: Tobie Baptist, MD;  Location: [facility] OR;  Service: Ophthalmology;  Laterality: Right;   WRIST SURGERY     left     Current Outpatient Medications  Medication Sig Dispense Refill   augmented betamethasone dipropionate (DIPROLENE-AF) 0.05 % ointment Apply 1 Application topically as needed.     Cholecalciferol (VITAMIN D3) 50 MCG (2000 UT) capsule Take 2,000 Units by mouth daily.     escitalopram (LEXAPRO) 10 MG tablet Take 10 mg by mouth daily.     Na Sulfate-K Sulfate-Mg Sulfate concentrate (SUPREP) 17.5-3.13-1.6 GM/177ML SOLN Take 1 kit (354 mLs total) by mouth once for 1 dose. 354 mL 0   olmesartan (BENICAR) 40 MG tablet Take 40 mg by mouth daily.     No current facility-administered medications for this visit.    Allergies as of 01/11/2024 - Review Complete 01/11/2024  Allergen Reaction Noted   Penicillins Anaphylaxis and Swelling 08/10/2007   Levofloxacin  Hives 06/12/2019    Family History  Problem Relation Age of Onset   Colon cancer Father 49   Heart attack Father    Heart failure Mother     Social History   Tobacco Use   Smoking status: Never   Smokeless tobacco: Never  Vaping Use   Vaping status: Never Used  Substance Use Topics   Alcohol  use: No   Drug use: No     Review of Systems:    Constitutional: No unexplained weight loss, fever, chills Cardiovascular: No chest pain Respiratory: No SOB  Gastrointestinal: See HPI and otherwise negative Hematologic: No bleeding or bruising    Physical Exam:  Vital signs: BP 106/66   Pulse 65   Ht 5' 9 (1.753 m)   Wt 215 lb (97.5 kg)   SpO2 96%   BMI 31.75 kg/m   Constitutional: Pleasant, well-appearing male in NAD, alert and  cooperative Head:  Normocephalic and atraumatic.  Eyes: No scleral icterus.  Respiratory: Respirations even and unlabored. Lungs clear to auscultation bilaterally.  No wheezes, crackles, or rhonchi.  Cardiovascular:  Regular rate and rhythm. No murmurs. No peripheral edema. Gastrointestinal:  Soft, nondistended, nontender. No rebound or guarding. Normal bowel sounds. No appreciable masses or hepatomegaly. Rectal:  Not performed.  Neurologic:  Alert and oriented x4;  grossly normal neurologically.  Skin:   Dry and intact without significant lesions or rashes. Psychiatric: Oriented to person, place and time. Demonstrates good judgement and reason without abnormal affect or behaviors.   RELEVANT LABS AND IMAGING: CBC    Component Value Date/Time   WBC 11.9 (H) 05/02/2022 2226   RBC 5.22 05/02/2022 2226   HGB 14.3 05/02/2022 2226   HCT 43.7 05/02/2022 2226   PLT 279 05/02/2022 2226   MCV 83.7 05/02/2022 2226   MCH 27.4 05/02/2022 2226   MCHC 32.7 05/02/2022 2226   RDW 14.2 05/02/2022 2226  LYMPHSABS 1.2 06/06/2019 2229   MONOABS 1.3 (H) 06/06/2019 2229   EOSABS 0.2 06/06/2019 2229   BASOSABS 0.1 06/06/2019 2229    CMP     Component Value Date/Time   NA 139 05/02/2022 2226   K 4.0 05/02/2022 2226   CL 107 05/02/2022 2226   CO2 24 05/02/2022 2226   GLUCOSE 204 (H) 05/02/2022 2226   BUN 23 05/02/2022 2226   CREATININE 1.21 05/02/2022 2226   CALCIUM 9.1 05/02/2022 2226   PROT 7.4 05/02/2022 2226   ALBUMIN 4.2 05/02/2022 2226   AST 18 05/02/2022 2226   ALT 14 05/02/2022 2226   ALKPHOS 48 05/02/2022 2226   BILITOT 0.7 05/02/2022 2226   GFRNONAA >60 05/02/2022 2226   GFRAA 47 (L) 06/06/2019 2229     Assessment/Plan:   History of colon polyps Family history of colon cancer - father GERD Esophageal dysphagia History of esophageal stricture s/p dilation Patient seen today for evaluation of esophageal dysphagia.  Does have history of esophageal stricture which was  dilated on EGD in 2015.  In the last year has been having some trouble swallowing with sensation of food getting stuck and requiring regurgitation.  Denies any heartburn, does have occasional acid reflux for which he takes Pepcid as needed.  Denies abdominal pain, melena, unintentional weight loss. He also has history of colon polyps and family history of colon cancer.  Last colonoscopy done 2020 and he is due based on 5-year recall.  - Schedule EGD with dilation and colonoscopy. I thoroughly discussed the procedure with the patient to include nature of the procedure, alternatives, benefits, and risks (including but not limited to bleeding, infection, perforation, anesthesia/cardiac/pulmonary complications). Patient verbalized understanding and gave verbal consent to proceed with procedure.    Camie Furbish, PA-C [facility] 01/11/2024, 5:27 PM  Patient Care Team: Loreli Elsie JONETTA Mickey., MD as PCP - General (Internal Medicine)

## 2024-01-11 NOTE — Patient Instructions (Signed)
 You have been scheduled for an endoscopy and colonoscopy. Please follow the written instructions given to you at your visit today.  If you use inhalers (even only as needed), please bring them with you on the day of your procedure.  DO NOT TAKE 7 DAYS PRIOR TO TEST- Trulicity (dulaglutide) Ozempic, Wegovy (semaglutide) Mounjaro (tirzepatide) Bydureon Bcise (exanatide extended release)  DO NOT TAKE 1 DAY PRIOR TO YOUR TEST Rybelsus (semaglutide) Adlyxin (lixisenatide) Victoza (liraglutide) Byetta (exanatide) ___________________________________________________________________________  _______________________________________________________  If your blood pressure at your visit was 140/90 or greater, please contact your primary care physician to follow up on this.  _______________________________________________________  If you are age 74 or older, your body mass index should be between 23-30. Your Body mass index is 31.75 kg/m. If this is out of the aforementioned range listed, please consider follow up with your Primary Care Provider.  If you are age 89 or younger, your body mass index should be between 19-25. Your Body mass index is 31.75 kg/m. If this is out of the aformentioned range listed, please consider follow up with your Primary Care Provider.   ________________________________________________________  The [facility] providers would like to encourage you to use MYCHART to communicate with providers for non-urgent requests or questions.  Due to long hold times on the telephone, sending your provider a message by [facility] may be a faster and more efficient way to get a response.  Please allow 48 business hours for a response.  Please remember that this is for non-urgent requests.  _______________________________________________________  [facility] is using a team-based approach to care.  Your team is made up of your doctor and two to three APPS. Our APPS (Nurse  Practitioners and Physician Assistants) work with your physician to ensure care continuity for you. They are fully qualified to address your health concerns and develop a treatment plan. They communicate directly with your gastroenterologist to care for you. Seeing the Advanced Practice Practitioners on your physician's team can help you by facilitating care more promptly, often allowing for earlier appointments, access to diagnostic testing, procedures, and other specialty referrals.    Thank you for trusting me with your gastrointestinal care!    Camie Furbish, PA-C [facility]

## 2024-01-15 ENCOUNTER — Encounter: Payer: Self-pay | Admitting: Internal Medicine

## 2024-01-19 DIAGNOSIS — G4733 Obstructive sleep apnea (adult) (pediatric): Secondary | ICD-10-CM | POA: Diagnosis not present

## 2024-01-23 ENCOUNTER — Encounter: Payer: Self-pay | Admitting: Internal Medicine

## 2024-01-23 ENCOUNTER — Ambulatory Visit (AMBULATORY_SURGERY_CENTER): Admitting: Internal Medicine

## 2024-01-23 VITALS — BP 128/82 | HR 69 | Temp 98.1°F | Resp 20 | Ht 69.0 in | Wt 215.0 lb

## 2024-01-23 DIAGNOSIS — K5730 Diverticulosis of large intestine without perforation or abscess without bleeding: Secondary | ICD-10-CM

## 2024-01-23 DIAGNOSIS — K222 Esophageal obstruction: Secondary | ICD-10-CM

## 2024-01-23 DIAGNOSIS — K562 Volvulus: Secondary | ICD-10-CM | POA: Diagnosis not present

## 2024-01-23 DIAGNOSIS — R1310 Dysphagia, unspecified: Secondary | ICD-10-CM

## 2024-01-23 DIAGNOSIS — G4733 Obstructive sleep apnea (adult) (pediatric): Secondary | ICD-10-CM | POA: Diagnosis not present

## 2024-01-23 DIAGNOSIS — R1319 Other dysphagia: Secondary | ICD-10-CM

## 2024-01-23 DIAGNOSIS — Z1211 Encounter for screening for malignant neoplasm of colon: Secondary | ICD-10-CM

## 2024-01-23 DIAGNOSIS — K648 Other hemorrhoids: Secondary | ICD-10-CM | POA: Diagnosis not present

## 2024-01-23 DIAGNOSIS — K2289 Other specified disease of esophagus: Secondary | ICD-10-CM | POA: Diagnosis not present

## 2024-01-23 DIAGNOSIS — Z800 Family history of malignant neoplasm of digestive organs: Secondary | ICD-10-CM

## 2024-01-23 DIAGNOSIS — Z860100 Personal history of colon polyps, unspecified: Secondary | ICD-10-CM

## 2024-01-23 DIAGNOSIS — I10 Essential (primary) hypertension: Secondary | ICD-10-CM | POA: Diagnosis not present

## 2024-01-23 MED ORDER — OMEPRAZOLE 40 MG PO CPDR
40.0000 mg | DELAYED_RELEASE_CAPSULE | Freq: Every day | ORAL | 3 refills | Status: AC
Start: 2024-01-23 — End: ?

## 2024-01-23 MED ORDER — SODIUM CHLORIDE 0.9 % IV SOLN: 500.0000 mL | Freq: Once | INTRAVENOUS | Status: DC

## 2024-01-23 NOTE — Op Note (Signed)
 [facility] Patient Name: Jorge Burton Procedure Date: 01/23/2024 10:43 AM MRN: [id number] Endoscopist: Lupita FORBES Commander , MD, [id number] Age: 73 Referring MD:  Date of Birth: 1950/08/04 Gender: Male Account #: [id number] Procedure:                Upper GI endoscopy Indications:              Dysphagia Medicines:                Monitored Anesthesia Care Procedure:                Pre-Anesthesia Assessment:                           - Prior to the procedure, a History and Physical                            was performed, and patient medications and                            allergies were reviewed. The patient's tolerance of                            previous anesthesia was also reviewed. The risks                            and benefits of the procedure and the sedation                            options and risks were discussed with the patient.                            All questions were answered, and informed consent                            was obtained. Prior Anticoagulants: The patient has                            taken no anticoagulant or antiplatelet agents. ASA                            Grade Assessment: II - A patient with mild systemic                            disease. After reviewing the risks and benefits,                            the patient was deemed in satisfactory condition to                            undergo the procedure.                           After obtaining informed consent, the endoscope was  passed under direct vision. Throughout the                            procedure, the patient's blood pressure, pulse, and                            oxygen saturations were monitored continuously. The                            GIF [id number] #[id number] was introduced through the                            mouth, and advanced to the second part of duodenum.                            The upper GI endoscopy was accomplished without                             difficulty. The patient tolerated the procedure                            well. Scope In: Scope Out: Findings:                 One benign-appearing, intrinsic severe (stenosis;                            an endoscope cannot pass) stenosis was found at the                            gastroesophageal junction. This stenosis measured 9                            mm (inner diameter) x less than one cm (in length).                            The stenosis was traversed after dilation. A TTS                            dilator was passed through the scope. Dilation with                            a 03-02-11 mm balloon dilator was performed to 12                            mm. The dilation site was examined and showed                            moderate mucosal disruption. Estimated blood loss                            was minimal.  Mucosal changes including longitudinal furrows were                            found in the entire esophagus. Biopsies were                            obtained from the proximal and distal esophagus                            with cold forceps for histology of suspected                            eosinophilic esophagitis. Verification of patient                            identification for the specimen was done. Estimated                            blood loss was minimal.                           The cardia and gastric fundus were oherwise normal                            on retroflexion.                           The exam was otherwise without abnormality.                           The gastroesophageal flap valve was visualized                            endoscopically and classified as Hill Grade IV (no                            fold, wide open lumen, hiatal hernia present). Complications:            No immediate complications. Estimated Blood Loss:     Estimated blood loss was minimal. Impression:                - Benign-appearing esophageal stenosis. Scope would                            not pass until it was dilated - 12 mm maximum today.                           - Esophageal mucosal changes suspicious for                            eosinophilic esophagitis.                           - The examination was otherwise normal.                           -  Gastroesophageal flap valve classified as Hill                            Grade IV (no fold, wide open lumen, hiatal hernia                            present). Small hiatal hernia.                           - Biopsies were taken with a cold forceps for                            evaluation of eosinophilic esophagitis. Recommendation:           - Patient has a contact number available for                            emergencies. The signs and symptoms of potential                            delayed complications were discussed with the                            patient. Return to normal activities tomorrow.                            Written discharge instructions were provided to the                            patient.                           - Clear liquids x 1 hour then soft foods rest of                            day. Start prior diet tomorrow.                           - Continue present medications.                           - Await pathology results.                           - Start omeprazole  40 mg qAM - Rx sent                           - Schedule repeat EGD and dilation next available Lupita FORBES Commander, MD 01/23/2024 11:44:37 AM This report has been signed electronically.

## 2024-01-23 NOTE — Progress Notes (Signed)
 Pt didn't pass gas in the RR.  Denies pain or need to do so.  Encouraged to ambulate at home, drink warm fluids if needed

## 2024-01-23 NOTE — Op Note (Addendum)
 [facility] Patient Name: Jorge Burton Procedure Date: 01/23/2024 10:39 AM MRN: [id number] Endoscopist: Lupita FORBES Commander , MD, [id number] Age: 73 Referring MD:  Date of Birth: Jan 24, 1951 Gender: Male Account #: [id number] Procedure:                Colonoscopy Indications:              High risk colon cancer surveillance: Personal                            history of colonic polyps, Last colonoscopy: 2020 Medicines:                Monitored Anesthesia Care Procedure:                Pre-Anesthesia Assessment:                           - Prior to the procedure, a History and Physical                            was performed, and patient medications and                            allergies were reviewed. The patient's tolerance of                            previous anesthesia was also reviewed. The risks                            and benefits of the procedure and the sedation                            options and risks were discussed with the patient.                            All questions were answered, and informed consent                            was obtained. Prior Anticoagulants: The patient has                            taken no anticoagulant or antiplatelet agents. ASA                            Grade Assessment: II - A patient with mild systemic                            disease. After reviewing the risks and benefits,                            the patient was deemed in satisfactory condition to                            undergo the procedure.  After obtaining informed consent, the colonoscope                            was passed under direct vision. Throughout the                            procedure, the patient's blood pressure, pulse, and                            oxygen saturations were monitored continuously. The                            Olympus Scope SN: [id number] was introduced through                            the anus and  advanced to the the cecum, identified                            by appendiceal orifice and ileocecal valve. The                            colonoscopy was somewhat difficult due to                            significant looping. Successful completion of the                            procedure was aided by applying abdominal pressure.                            The patient tolerated the procedure well. The                            quality of the bowel preparation was good. The                            ileocecal valve, appendiceal orifice, and rectum                            were photographed. Scope In: 11:16:07 AM Scope Out: 11:28:38 AM Scope Withdrawal Time: 0 hours 8 minutes 2 seconds  Total Procedure Duration: 0 hours 12 minutes 31 seconds  Findings:                 The perianal and digital rectal examinations were                            normal.                           Multiple diverticula were found in the sigmoid                            colon.  Internal hemorrhoids were found.                           The exam was otherwise without abnormality on                            direct and retroflexion views. Complications:            No immediate complications. Estimated Blood Loss:     Estimated blood loss: none. Impression:               - Diverticulosis in the sigmoid colon.                           - Internal hemorrhoids.                           - The examination was otherwise normal on direct                            and retroflexion views.                           - No specimens collected. Recommendation:           - Patient has a contact number available for                            emergencies. The signs and symptoms of potential                            delayed complications were discussed with the                            patient. Return to normal activities tomorrow.                            Written discharge  instructions were provided to the                            patient.                           - Clear liquids x 1 hour then soft foods rest of                            day. Start prior diet tomorrow.                           - See the other procedure note for documentation of                            additional recommendations.                           - No repeat colonoscopy due to age and the absence  of colonic polyps. Father had CRCA but it was dx at                            7 so not at increased risk per guidelines.                            Discussed in recovery - he can ask about repeat                            colonoscopy in 5 years if desired but no recall. Lupita FORBES Commander, MD 01/23/2024 11:48:45 AM This report has been signed electronically.

## 2024-01-23 NOTE — Progress Notes (Signed)
 Report given to PACU, vss

## 2024-01-23 NOTE — Progress Notes (Signed)
 Pt's states no medical or surgical changes since previsit or office visit.

## 2024-01-23 NOTE — Progress Notes (Signed)
 Called to room to assist during endoscopic procedure.  Patient ID and intended procedure confirmed with present staff. Received instructions for my participation in the procedure from the performing physician.

## 2024-01-23 NOTE — Progress Notes (Signed)
 1052 Robinul 0.1 mg IV given due large amount of secretions upon assessment.  MD made aware, vss

## 2024-01-23 NOTE — Patient Instructions (Addendum)
 There was a stricture in the esophagus, tighter than in 2015. I dilated it to 12 mm. I also took biopsies of the esophagus to check for possible eosinophilic esophagitis.  Am starting omeprazole  to treat and want you to return for a repeat dilation in coming weeks as this is not open enough but was as much I could do today.  No colon polyps.  Still have diverticulosis and hemorrhoids were somewhat swollen.  No more routine repeat colonoscopy.  I appreciate the opportunity to care for you. Lupita CHARLENA Commander, MD, [facility]  Clear liquids x 1 hour then soft foods rest of day. Start prior diet tomorrow. No repeat colonoscopy due to age and the absence of colonic polyps.  YOU HAD AN ENDOSCOPIC PROCEDURE TODAY AT THE [facility]:   Refer to the procedure report that was given to you for any specific questions about what was found during the examination.  If the procedure report does not answer your questions, please call your gastroenterologist to clarify.  If you requested that your care partner not be given the details of your procedure findings, then the procedure report has been included in a sealed envelope for you to review at your convenience later.  YOU SHOULD EXPECT: Some feelings of bloating in the abdomen. Passage of more gas than usual.  Walking can help get rid of the air that was put into your GI tract during the procedure and reduce the bloating. If you had a lower endoscopy (such as a colonoscopy or flexible sigmoidoscopy) you may notice spotting of blood in your stool or on the toilet paper. If you underwent a bowel prep for your procedure, you may not have a normal bowel movement for a few days.  Please Note:  You might notice some irritation and congestion in your nose or some drainage.  This is from the oxygen used during your procedure.  There is no need for concern and it should clear up in a day or so.  SYMPTOMS TO REPORT IMMEDIATELY:  Following lower endoscopy  (colonoscopy or flexible sigmoidoscopy):  Excessive amounts of blood in the stool  Significant tenderness or worsening of abdominal pains  Swelling of the abdomen that is new, acute  Fever of 100F or higher  Following upper endoscopy (EGD)  Vomiting of blood or coffee ground material  New chest pain or pain under the shoulder blades  Painful or persistently difficult swallowing  New shortness of breath  Fever of 100F or higher  Black, tarry-looking stools  For urgent or emergent issues, a gastroenterologist can be reached at any hour by calling [phone]. Do not use MyChart messaging for urgent concerns.    DIET:  We do recommend a small meal at first, but then you may proceed to your regular diet.  Drink plenty of fluids but you should avoid alcoholic beverages for 24 hours.  ACTIVITY:  You should plan to take it easy for the rest of today and you should NOT DRIVE or use heavy machinery until tomorrow (because of the sedation medicines used during the test).    FOLLOW UP: Our staff will call the number listed on your records the next business day following your procedure.  We will call around 7:15- 8:00 am to check on you and address any questions or concerns that you may have regarding the information given to you following your procedure. If we do not reach you, we will leave a message.     If any biopsies were  taken you will be contacted by phone or by letter within the next 1-3 weeks.  Please call us  at [phone] if you have not heard about the biopsies in 3 weeks.    SIGNATURES/CONFIDENTIALITY: You and/or your care partner have signed paperwork which will be entered into your electronic medical record.  These signatures attest to the fact that that the information above on your After Visit Summary has been reviewed and is understood.  Full responsibility of the confidentiality of this discharge information lies with you and/or your care-partner.

## 2024-01-23 NOTE — Progress Notes (Signed)
 History and Physical Interval Note:  01/23/2024 10:51 AM  Jorge Burton  has presented today for endoscopic procedure(s), with the diagnosis of  Encounter Diagnoses  Name Primary?   Esophageal dysphagia Yes   History of colonic polyps    Family history of colon cancer in father   .  The various methods of evaluation and treatment have been discussed with the patient and/or family. After consideration of risks, benefits and other options for treatment, the patient has consented to  the endoscopic procedure(s).   The patient's history has been reviewed, patient examined, no change in status, stable for endoscopic procedure(s).  I have reviewed the patient's chart and labs.  Questions were answered to the patient's satisfaction.     Lupita CHARLENA Commander, MD, [facility]

## 2024-01-24 ENCOUNTER — Telehealth: Payer: Self-pay

## 2024-01-24 NOTE — Telephone Encounter (Signed)
  Follow up Call-     01/23/2024    9:59 AM  Call back number  Post procedure Call Back phone  # [phone]  Permission to leave phone message Yes     Patient questions:  Do you have a fever, pain , or abdominal swelling? No. Pain Score  0 *  Have you tolerated food without any problems? Yes.    Have you been able to return to your normal activities? Yes.    Do you have any questions about your discharge instructions: Diet   No. Medications  No. Follow up visit  No.  Do you have questions or concerns about your Care? No.  Actions: * If pain score is 4 or above: No action needed, pain <4.

## 2024-01-25 ENCOUNTER — Ambulatory Visit: Payer: Self-pay | Admitting: Internal Medicine

## 2024-01-25 LAB — SURGICAL PATHOLOGY

## 2024-01-31 DIAGNOSIS — L400 Psoriasis vulgaris: Secondary | ICD-10-CM | POA: Diagnosis not present

## 2024-02-18 NOTE — Progress Notes (Unsigned)
 [facility] History and Physical   Primary Care Physician:  Loreli Elsie JONETTA Mickey., MD   Reason for Procedure:   Dysphagia, esophageal stricture  Plan:    EGD, esophageal dilation     HPI: Arth Nicastro is a 73 y.o. male here for repeat EGD and esophageal dilation. Had last exam 01/25/24 - dilated distal esophageal stricture to 12 mm. Had mucosal changes suggesting EoE - distal esophageal bxs w/ > 25 eos/hpf but proximal nxs focal eos not > 10/hpf   Past Medical History:  Diagnosis Date   Anxiety    Colon polyps 05/22/2001   Adenomatous polyp   COVID 11/2021   in bed for a week   Depression    Diverticulosis of colon (without mention of hemorrhage) 05/22/2006   Generalized anxiety disorder    GERD (gastroesophageal reflux disease)    Heart murmur    as a child. No one mentions it now   History of kidney stones    Hypertension    Obesity    OSA (obstructive sleep apnea)    CPAP    Past Surgical History:  Procedure Laterality Date   COLONOSCOPY     mulitple    EXTRACORPOREAL SHOCK WAVE LITHOTRIPSY Right 05/11/2022   Procedure: EXTRACORPOREAL SHOCK WAVE LITHOTRIPSY (ESWL);  Surgeon: Penne Knee, MD;  Location: [facility] ORS;  Service: Urology;  Laterality: Right;   EYE EXAMINATION UNDER ANESTHESIA Right 07/29/2018   Procedure: INTRA-VITREOUS LOCALIZATION;  Surgeon: Tobie Baptist, MD;  Location[facility] OR;  Service: Ophthalmology;  Laterality: Right;   EYE SURGERY     right eye - cataract    FRACTURE SURGERY Left    Ankle 2015   GAS/FLUID EXCHANGE Right 03/25/2022   Procedure: GAS/FLUID EXCHANGE;  Surgeon: Tobie Baptist, MD;  Location: [facility] OR;  Service: Ophthalmology;  Laterality: Right;   PARS PLANA VITRECTOMY Right 03/25/2022   Procedure: PARS PLANA VITRECTOMY WITH 25 GAUGE; ENDOLASER;  Surgeon: Tobie Baptist, MD;  Location: [facility] OR;  Service: Ophthalmology;  Laterality: Right;   REPAIR OF COMPLEX TRACTION RETINAL DETACHMENT Right 03/25/2022   Procedure:  REPAIR OF RETINAL DETACHMENT; DRAINAGE OF SUBRETINAL FLUID;  Surgeon: Tobie Baptist, MD;  Location: [facility] OR;  Service: Ophthalmology;  Laterality: Right;   WRIST SURGERY     left      Current Outpatient Medications  Medication Sig Dispense Refill   augmented betamethasone dipropionate (DIPROLENE-AF) 0.05 % ointment Apply 1 Application topically as needed.     Cholecalciferol (VITAMIN D3) 50 MCG (2000 UT) capsule Take 2,000 Units by mouth daily.     escitalopram (LEXAPRO) 10 MG tablet Take 10 mg by mouth daily.     olmesartan (BENICAR) 40 MG tablet Take 40 mg by mouth daily.     omeprazole  (PRILOSEC) 40 MG capsule Take 1 capsule (40 mg total) by mouth daily before breakfast. 90 capsule 3   No current facility-administered medications for this visit.    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   Occupation: professor -retired    Associate Professor: [employer]   Occupation: Engineering geologist: [employer]  Tobacco Use   Smoking status: Never   Smokeless tobacco: Never  Vaping Use   Vaping status: Never Used  Substance and Sexual Activity   Alcohol  use: No   Drug use: No   Sexual activity: Not on file  Other Topics Concern   Not on file  Social History Narrative   Married x 2 - 1 son - grown - Runner, broadcasting/film/video, Water engineer (teen)  second wife, 2 grown daughters and 4 grandchildren first wife   High school biology/anatomy teacher  [employer]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  Physical Exam: Vital signs There were no vitals taken for this visit.  General:   Alert,  Well-developed, well-nourished, pleasant and cooperative in NAD Lungs:  Clear throughout to auscultation.   Heart:  Regular rate and rhythm; no murmurs, clicks, rubs,  or gallops. Abdomen:  Soft, nontender and nondistended. Normal bowel sounds.   Neuro/Psych:  Alert and cooperative. Normal mood and affect. A and O x 3   @Ellison Rieth  CHARLENA Commander, MD, [facility] [phone] (pager) 02/18/2024 8:04 PM@

## 2024-02-19 ENCOUNTER — Encounter: Payer: Self-pay | Admitting: Internal Medicine

## 2024-02-19 ENCOUNTER — Ambulatory Visit (AMBULATORY_SURGERY_CENTER): Admitting: Internal Medicine

## 2024-02-19 VITALS — BP 117/78 | HR 61 | Temp 97.9°F | Resp 10 | Ht 69.0 in | Wt 215.0 lb

## 2024-02-19 DIAGNOSIS — F419 Anxiety disorder, unspecified: Secondary | ICD-10-CM | POA: Diagnosis not present

## 2024-02-19 DIAGNOSIS — K222 Esophageal obstruction: Secondary | ICD-10-CM

## 2024-02-19 DIAGNOSIS — F329 Major depressive disorder, single episode, unspecified: Secondary | ICD-10-CM | POA: Diagnosis not present

## 2024-02-19 DIAGNOSIS — R1319 Other dysphagia: Secondary | ICD-10-CM | POA: Diagnosis not present

## 2024-02-19 DIAGNOSIS — G4733 Obstructive sleep apnea (adult) (pediatric): Secondary | ICD-10-CM | POA: Diagnosis not present

## 2024-02-19 DIAGNOSIS — K449 Diaphragmatic hernia without obstruction or gangrene: Secondary | ICD-10-CM

## 2024-02-19 DIAGNOSIS — R1310 Dysphagia, unspecified: Secondary | ICD-10-CM

## 2024-02-19 DIAGNOSIS — K229 Disease of esophagus, unspecified: Secondary | ICD-10-CM | POA: Diagnosis not present

## 2024-02-19 DIAGNOSIS — K219 Gastro-esophageal reflux disease without esophagitis: Secondary | ICD-10-CM | POA: Diagnosis not present

## 2024-02-19 DIAGNOSIS — I10 Essential (primary) hypertension: Secondary | ICD-10-CM | POA: Diagnosis not present

## 2024-02-19 MED ORDER — SODIUM CHLORIDE 0.9 % IV SOLN: 500.0000 mL | Freq: Once | INTRAVENOUS | Status: DC

## 2024-02-19 NOTE — Op Note (Signed)
 [facility] Patient Name: Jorge Burton Procedure Date: 02/19/2024 10:31 AM MRN: [id number] Endoscopist: Lupita FORBES Commander , MD, [id number] Age: 73 Referring MD:  Date of Birth: 03/25/1951 Gender: Male Account #: [id number] Procedure:                Upper GI endoscopy Indications:              Dysphagia, For therapy of esophageal stricture Medicines:                Monitored Anesthesia Care Procedure:                Pre-Anesthesia Assessment:                           - Prior to the procedure, a History and Physical                            was performed, and patient medications and                            allergies were reviewed. The patient's tolerance of                            previous anesthesia was also reviewed. The risks                            and benefits of the procedure and the sedation                            options and risks were discussed with the patient.                            All questions were answered, and informed consent                            was obtained. Prior Anticoagulants: The patient has                            taken no anticoagulant or antiplatelet agents. ASA                            Grade Assessment: III - A patient with severe                            systemic disease. After reviewing the risks and                            benefits, the patient was deemed in satisfactory                            condition to undergo the procedure.                           After obtaining informed consent, the endoscope was  passed under direct vision. Throughout the                            procedure, the patient's blood pressure, pulse, and                            oxygen saturations were monitored continuously. The                            GIF [id number] #[id number] was introduced through the                            mouth, and advanced to the second part of duodenum.                            The  upper GI endoscopy was accomplished without                            difficulty. The patient tolerated the procedure                            well. Scope In: Scope Out: Findings:                 One benign-appearing, intrinsic moderate                            (circumferential scarring or stenosis; an endoscope                            may pass) stenosis was found at the                            gastroesophageal junction. This stenosis measured                            1.1 cm (inner diameter) x less than one cm (in                            length). The stenosis was traversed with the scope                            which did dilate the stricture some. A TTS dilator                            was then passed through the scope. Dilation with a                            12-13.5-15 mm balloon dilator was performed to 15                            mm. The dilation site was examined and showed  moderate mucosal disruption. Estimated blood loss                            was minimal.                           A small hiatal hernia was present.                           The gastroesophageal flap valve was visualized                            endoscopically and classified as Hill Grade IV (no                            fold, wide open lumen, hiatal hernia present).                           The exam was otherwise without abnormality.                           The cardia and gastric fundus were otherwise normal                            on retroflexion.                           Biopsies were obtained from the proximal and distal                            esophagus with cold forceps for histology of                            suspected eosinophilic esophagitis. Complications:            No immediate complications. Estimated Blood Loss:     Estimated blood loss was minimal. Impression:               - Benign-appearing esophageal stenosis. Dilated.                             Dilated to 15 mm (last dilation 9/5 10-12 mm).                           - Small hiatal hernia.                           - Gastroesophageal flap valve classified as Hill                            Grade IV (no fold, wide open lumen, hiatal hernia                            present).                           - The  examination was otherwise normal.                           - Biopsies were taken with a cold forceps for                            evaluation of eosinophilic esophagitis. Last exam                            mucosa had changes suggestive of, not today. There                            were > 25 eosinophils/hpf diatal and focal                            eosinophils proximal so most likely GERD. Recommendation:           - Patient has a contact number available for                            emergencies. The signs and symptoms of potential                            delayed complications were discussed with the                            patient. Return to normal activities tomorrow.                            Written discharge instructions were provided to the                            patient.                           - Clear liquids x 1 hour then soft foods rest of                            day. Start prior diet tomorrow.                           - Continue present medications.                           - Await pathology results.                           - Will contact with pathology results and review                            dysphagia then - he may need a repeat dilation. Lupita FORBES Commander, MD 02/19/2024 11:11:49 AM This report has been signed electronically.

## 2024-02-19 NOTE — Progress Notes (Signed)
 Pt's states no medical or surgical changes since previsit or office visit.

## 2024-02-19 NOTE — Progress Notes (Signed)
 Vss nad trans to pacu

## 2024-02-19 NOTE — Patient Instructions (Addendum)
 I dilated to 15 mm today and took more esophageal biopsies.  I think this is from GERD or acid reflux disease but still checking on possibility of eosinophilic esophagitis.  I will contact you with results and plans. You may need another dilation.  Stay on omeprazole .  I appreciate the opportunity to care for you. Lupita CHARLENA Commander, MD, [facility]   Handouts given: Hiatal hernia and GERD, Stricture, and Post Esophageal Dilation Diet.  Clear liquids for 1 hour then soft foods rest of day. Then start prior diet tomorrow. Continue present medications.  Will contact you with pathology results and review dysphagia then - may need a repeat dilation.   YOU HAD AN ENDOSCOPIC PROCEDURE TODAY AT THE [facility]:   Refer to the procedure report that was given to you for any specific questions about what was found during the examination.  If the procedure report does not answer your questions, please call your gastroenterologist to clarify.  If you requested that your care partner not be given the details of your procedure findings, then the procedure report has been included in a sealed envelope for you to review at your convenience later.  YOU SHOULD EXPECT: Some feelings of bloating in the abdomen. Passage of more gas than usual.  Walking can help get rid of the air that was put into your GI tract during the procedure and reduce the bloating. If you had a lower endoscopy (such as a colonoscopy or flexible sigmoidoscopy) you may notice spotting of blood in your stool or on the toilet paper. If you underwent a bowel prep for your procedure, you may not have a normal bowel movement for a few days.  Please Note:  You might notice some irritation and congestion in your nose or some drainage.  This is from the oxygen used during your procedure.  There is no need for concern and it should clear up in a day or so.  SYMPTOMS TO REPORT IMMEDIATELY:  Following upper endoscopy (EGD)  Vomiting of blood or  coffee ground material  New chest pain or pain under the shoulder blades  Painful or persistently difficult swallowing  New shortness of breath  Fever of 100F or higher  Black, tarry-looking stools  For urgent or emergent issues, a gastroenterologist can be reached at any hour by calling [phone]. Do not use MyChart messaging for urgent concerns.    DIET:  Clear liquids for 1 hour then soft foods rest of day. Then start prior diet tomorrow. Drink plenty of fluids but you should avoid alcoholic beverages for 24 hours.  ACTIVITY:  You should plan to take it easy for the rest of today and you should NOT DRIVE or use heavy machinery until tomorrow (because of the sedation medicines used during the test).    FOLLOW UP: Our staff will call the number listed on your records the next business day following your procedure.  We will call around 7:15- 8:00 am to check on you and address any questions or concerns that you may have regarding the information given to you following your procedure. If we do not reach you, we will leave a message.     If any biopsies were taken you will be contacted by phone or by letter within the next 1-3 weeks.  Please call us  at [phone] if you have not heard about the biopsies in 3 weeks.    SIGNATURES/CONFIDENTIALITY: You and/or your care partner have signed paperwork which will be entered into  your electronic medical record.  These signatures attest to the fact that that the information above on your After Visit Summary has been reviewed and is understood.  Full responsibility of the confidentiality of this discharge information lies with you and/or your care-partner.

## 2024-02-19 NOTE — Progress Notes (Signed)
 Called to room to assist during endoscopic procedure.  Patient ID and intended procedure confirmed with present staff. Received instructions for my participation in the procedure from the performing physician.

## 2024-02-20 ENCOUNTER — Telehealth: Payer: Self-pay

## 2024-02-20 NOTE — Telephone Encounter (Signed)
  Follow up Call-     02/19/2024    9:23 AM 01/23/2024    9:59 AM  Call back number  Post procedure Call Back phone  # [phone]  Permission to leave phone message Yes Yes     Patient questions:  Do you have a fever, pain , or abdominal swelling? No. Pain Score  0 *  Have you tolerated food without any problems? Yes.    Have you been able to return to your normal activities? Yes.    Do you have any questions about your discharge instructions: Diet   No. Medications  No. Follow up visit  No.  Do you have questions or concerns about your Care? No.  Actions: * If pain score is 4 or above: No action needed, pain <4.

## 2024-02-22 LAB — SURGICAL PATHOLOGY

## 2024-02-24 ENCOUNTER — Ambulatory Visit: Payer: Self-pay | Admitting: Internal Medicine

## 2024-02-27 DIAGNOSIS — H353121 Nonexudative age-related macular degeneration, left eye, early dry stage: Secondary | ICD-10-CM | POA: Diagnosis not present

## 2024-02-27 DIAGNOSIS — H35373 Puckering of macula, bilateral: Secondary | ICD-10-CM | POA: Diagnosis not present

## 2024-02-27 DIAGNOSIS — H35363 Drusen (degenerative) of macula, bilateral: Secondary | ICD-10-CM | POA: Diagnosis not present

## 2024-02-27 DIAGNOSIS — H35721 Serous detachment of retinal pigment epithelium, right eye: Secondary | ICD-10-CM | POA: Diagnosis not present

## 2024-02-27 DIAGNOSIS — H353112 Nonexudative age-related macular degeneration, right eye, intermediate dry stage: Secondary | ICD-10-CM | POA: Diagnosis not present

## 2024-02-27 DIAGNOSIS — Z8669 Personal history of other diseases of the nervous system and sense organs: Secondary | ICD-10-CM | POA: Diagnosis not present

## 2024-02-27 DIAGNOSIS — H59811 Chorioretinal scars after surgery for detachment, right eye: Secondary | ICD-10-CM | POA: Diagnosis not present

## 2024-04-29 DIAGNOSIS — R82998 Other abnormal findings in urine: Secondary | ICD-10-CM | POA: Diagnosis not present
